# Patient Record
Sex: Female | Born: 1945
Health system: Southern US, Community
[De-identification: ages and names within clinical notes are randomized; demographics above are authoritative.]

## PROBLEM LIST (undated history)

## (undated) DIAGNOSIS — M1990 Unspecified osteoarthritis, unspecified site: Secondary | ICD-10-CM

## (undated) DIAGNOSIS — E785 Hyperlipidemia, unspecified: Secondary | ICD-10-CM

## (undated) DIAGNOSIS — D510 Vitamin B12 deficiency anemia due to intrinsic factor deficiency: Secondary | ICD-10-CM

## (undated) DIAGNOSIS — I10 Essential (primary) hypertension: Secondary | ICD-10-CM

## (undated) DIAGNOSIS — E039 Hypothyroidism, unspecified: Secondary | ICD-10-CM

## (undated) HISTORY — DX: Hypothyroidism, unspecified: E03.9

## (undated) HISTORY — DX: Hyperlipidemia, unspecified: E78.5

## (undated) HISTORY — PX: THYROIDECTOMY: SHX17

## (undated) HISTORY — DX: Unspecified osteoarthritis, unspecified site: M19.90

## (undated) HISTORY — PX: ABDOMINAL HYSTERECTOMY: SHX81

## (undated) HISTORY — DX: Vitamin B12 deficiency anemia due to intrinsic factor deficiency: D51.0

## (undated) HISTORY — DX: Essential (primary) hypertension: I10

---

## 1969-03-06 HISTORY — PX: FRACTURE SURGERY: SHX138

## 1999-02-07 ENCOUNTER — Emergency Department (HOSPITAL_COMMUNITY): Admission: EM | Admit: 1999-02-07 | Discharge: 1999-02-07 | Payer: Self-pay | Admitting: Emergency Medicine

## 1999-02-07 ENCOUNTER — Encounter: Payer: Self-pay | Admitting: Internal Medicine

## 1999-04-22 ENCOUNTER — Other Ambulatory Visit: Admission: RE | Admit: 1999-04-22 | Discharge: 1999-04-22 | Payer: Self-pay | Admitting: Obstetrics and Gynecology

## 1999-07-07 HISTORY — PX: COLONOSCOPY: SHX174

## 1999-08-14 ENCOUNTER — Ambulatory Visit (HOSPITAL_COMMUNITY): Admission: RE | Admit: 1999-08-14 | Discharge: 1999-08-14 | Payer: Self-pay | Admitting: Internal Medicine

## 1999-08-14 ENCOUNTER — Encounter: Payer: Self-pay | Admitting: Internal Medicine

## 1999-08-17 ENCOUNTER — Emergency Department (HOSPITAL_COMMUNITY): Admission: EM | Admit: 1999-08-17 | Discharge: 1999-08-18 | Payer: Self-pay | Admitting: *Deleted

## 1999-12-09 ENCOUNTER — Encounter (INDEPENDENT_AMBULATORY_CARE_PROVIDER_SITE_OTHER): Payer: Self-pay

## 1999-12-09 ENCOUNTER — Other Ambulatory Visit: Admission: RE | Admit: 1999-12-09 | Discharge: 1999-12-09 | Payer: Self-pay | Admitting: Internal Medicine

## 2000-09-08 ENCOUNTER — Other Ambulatory Visit: Admission: RE | Admit: 2000-09-08 | Discharge: 2000-09-08 | Payer: Self-pay | Admitting: Internal Medicine

## 2000-09-14 ENCOUNTER — Other Ambulatory Visit: Admission: RE | Admit: 2000-09-14 | Discharge: 2000-09-14 | Payer: Self-pay | Admitting: Obstetrics & Gynecology

## 2002-09-02 ENCOUNTER — Encounter: Payer: Self-pay | Admitting: Emergency Medicine

## 2002-09-02 ENCOUNTER — Emergency Department (HOSPITAL_COMMUNITY): Admission: EM | Admit: 2002-09-02 | Discharge: 2002-09-02 | Payer: Self-pay | Admitting: Emergency Medicine

## 2004-05-27 ENCOUNTER — Ambulatory Visit: Payer: Self-pay | Admitting: Internal Medicine

## 2004-06-17 ENCOUNTER — Ambulatory Visit: Payer: Self-pay | Admitting: Internal Medicine

## 2004-07-29 ENCOUNTER — Ambulatory Visit: Payer: Self-pay | Admitting: Internal Medicine

## 2004-08-26 ENCOUNTER — Ambulatory Visit: Payer: Self-pay | Admitting: Internal Medicine

## 2004-09-18 ENCOUNTER — Ambulatory Visit: Payer: Self-pay | Admitting: Internal Medicine

## 2004-09-25 ENCOUNTER — Ambulatory Visit: Payer: Self-pay | Admitting: Internal Medicine

## 2004-10-07 ENCOUNTER — Encounter: Admission: RE | Admit: 2004-10-07 | Discharge: 2004-10-07 | Payer: Self-pay | Admitting: Internal Medicine

## 2004-10-29 ENCOUNTER — Ambulatory Visit: Payer: Self-pay | Admitting: Internal Medicine

## 2004-12-02 ENCOUNTER — Ambulatory Visit: Payer: Self-pay | Admitting: Internal Medicine

## 2004-12-12 ENCOUNTER — Other Ambulatory Visit: Admission: RE | Admit: 2004-12-12 | Discharge: 2004-12-12 | Payer: Self-pay | Admitting: Obstetrics and Gynecology

## 2004-12-29 ENCOUNTER — Ambulatory Visit: Payer: Self-pay | Admitting: Internal Medicine

## 2005-01-29 ENCOUNTER — Ambulatory Visit: Payer: Self-pay | Admitting: Internal Medicine

## 2005-03-13 ENCOUNTER — Ambulatory Visit: Payer: Self-pay | Admitting: Internal Medicine

## 2005-03-25 ENCOUNTER — Ambulatory Visit: Payer: Self-pay | Admitting: Internal Medicine

## 2005-04-13 ENCOUNTER — Ambulatory Visit: Payer: Self-pay | Admitting: Internal Medicine

## 2005-05-25 ENCOUNTER — Ambulatory Visit: Payer: Self-pay | Admitting: Internal Medicine

## 2005-07-01 ENCOUNTER — Ambulatory Visit: Payer: Self-pay | Admitting: Internal Medicine

## 2005-08-03 ENCOUNTER — Ambulatory Visit: Payer: Self-pay | Admitting: Family Medicine

## 2005-08-11 ENCOUNTER — Ambulatory Visit: Payer: Self-pay | Admitting: Internal Medicine

## 2005-08-26 ENCOUNTER — Ambulatory Visit: Payer: Self-pay | Admitting: Internal Medicine

## 2005-10-07 ENCOUNTER — Ambulatory Visit: Payer: Self-pay | Admitting: Internal Medicine

## 2005-11-24 ENCOUNTER — Ambulatory Visit: Payer: Self-pay | Admitting: Internal Medicine

## 2005-12-28 ENCOUNTER — Ambulatory Visit: Payer: Self-pay | Admitting: Internal Medicine

## 2006-01-07 ENCOUNTER — Ambulatory Visit: Payer: Self-pay | Admitting: Internal Medicine

## 2006-03-04 ENCOUNTER — Ambulatory Visit: Payer: Self-pay | Admitting: Internal Medicine

## 2006-04-13 ENCOUNTER — Ambulatory Visit: Payer: Self-pay | Admitting: Internal Medicine

## 2006-04-20 ENCOUNTER — Ambulatory Visit: Payer: Self-pay | Admitting: Internal Medicine

## 2006-04-20 LAB — CONVERTED CEMR LAB
ALT: 15 units/L (ref 0–40)
AST: 16 units/L (ref 0–37)
Chol/HDL Ratio, serum: 2.8
Cholesterol: 180 mg/dL (ref 0–200)
HDL: 64.3 mg/dL (ref >39.0)
Hgb A1c MFr Bld: 5.7 % (ref 4.6–6.0)
LDL Cholesterol: 91 mg/dL (ref 0–99)
TSH: 3.99 microintl units/mL (ref 0.35–5.50)
Triglyceride fasting, serum: 122 mg/dL (ref 0–149)
VLDL: 24 mg/dL (ref 0–40)

## 2006-05-12 ENCOUNTER — Ambulatory Visit: Payer: Self-pay | Admitting: Internal Medicine

## 2006-07-09 ENCOUNTER — Ambulatory Visit: Payer: Self-pay | Admitting: Internal Medicine

## 2006-09-02 ENCOUNTER — Ambulatory Visit: Payer: Self-pay | Admitting: Internal Medicine

## 2006-10-12 ENCOUNTER — Ambulatory Visit: Payer: Self-pay | Admitting: Internal Medicine

## 2006-10-20 DIAGNOSIS — I10 Essential (primary) hypertension: Secondary | ICD-10-CM | POA: Insufficient documentation

## 2006-10-20 DIAGNOSIS — E039 Hypothyroidism, unspecified: Secondary | ICD-10-CM | POA: Insufficient documentation

## 2006-12-08 ENCOUNTER — Ambulatory Visit: Payer: Self-pay | Admitting: Internal Medicine

## 2007-01-11 ENCOUNTER — Ambulatory Visit: Payer: Self-pay | Admitting: Internal Medicine

## 2007-03-11 ENCOUNTER — Ambulatory Visit: Payer: Self-pay | Admitting: Internal Medicine

## 2007-03-22 ENCOUNTER — Ambulatory Visit: Payer: Self-pay | Admitting: Family Medicine

## 2007-05-06 ENCOUNTER — Ambulatory Visit: Payer: Self-pay | Admitting: Internal Medicine

## 2007-07-12 ENCOUNTER — Telehealth (INDEPENDENT_AMBULATORY_CARE_PROVIDER_SITE_OTHER): Payer: Self-pay | Admitting: *Deleted

## 2007-07-13 ENCOUNTER — Ambulatory Visit: Payer: Self-pay | Admitting: Internal Medicine

## 2007-09-14 ENCOUNTER — Ambulatory Visit: Payer: Self-pay | Admitting: Internal Medicine

## 2007-09-21 ENCOUNTER — Telehealth (INDEPENDENT_AMBULATORY_CARE_PROVIDER_SITE_OTHER): Payer: Self-pay | Admitting: *Deleted

## 2007-09-21 ENCOUNTER — Emergency Department (HOSPITAL_COMMUNITY): Admission: EM | Admit: 2007-09-21 | Discharge: 2007-09-21 | Payer: Self-pay | Admitting: Emergency Medicine

## 2007-11-17 ENCOUNTER — Ambulatory Visit: Payer: Self-pay | Admitting: Internal Medicine

## 2007-11-17 DIAGNOSIS — D510 Vitamin B12 deficiency anemia due to intrinsic factor deficiency: Secondary | ICD-10-CM | POA: Insufficient documentation

## 2007-11-17 DIAGNOSIS — E8881 Metabolic syndrome: Secondary | ICD-10-CM | POA: Insufficient documentation

## 2008-01-24 ENCOUNTER — Ambulatory Visit: Payer: Self-pay | Admitting: Internal Medicine

## 2008-02-13 ENCOUNTER — Ambulatory Visit: Payer: Self-pay | Admitting: Internal Medicine

## 2008-04-13 ENCOUNTER — Ambulatory Visit: Payer: Self-pay | Admitting: Internal Medicine

## 2008-05-13 ENCOUNTER — Emergency Department (HOSPITAL_COMMUNITY): Admission: EM | Admit: 2008-05-13 | Discharge: 2008-05-14 | Payer: Self-pay | Admitting: Emergency Medicine

## 2008-05-23 ENCOUNTER — Encounter (INDEPENDENT_AMBULATORY_CARE_PROVIDER_SITE_OTHER): Payer: Self-pay | Admitting: *Deleted

## 2008-05-24 ENCOUNTER — Ambulatory Visit: Payer: Self-pay | Admitting: Internal Medicine

## 2008-06-04 ENCOUNTER — Encounter (INDEPENDENT_AMBULATORY_CARE_PROVIDER_SITE_OTHER): Payer: Self-pay | Admitting: *Deleted

## 2008-06-04 LAB — CONVERTED CEMR LAB
Creatinine,U: 114.9 mg/dL
Hgb A1c MFr Bld: 6 % (ref 4.6–6.0)
Microalb Creat Ratio: 5.2 mg/g (ref 0.0–30.0)
Microalb, Ur: 0.6 mg/dL (ref 0.0–1.9)

## 2008-07-03 ENCOUNTER — Ambulatory Visit: Payer: Self-pay | Admitting: Family Medicine

## 2008-09-04 ENCOUNTER — Ambulatory Visit: Payer: Self-pay | Admitting: Internal Medicine

## 2008-09-04 DIAGNOSIS — M5416 Radiculopathy, lumbar region: Secondary | ICD-10-CM | POA: Insufficient documentation

## 2008-11-20 ENCOUNTER — Ambulatory Visit: Payer: Self-pay | Admitting: Internal Medicine

## 2008-11-21 LAB — CONVERTED CEMR LAB: Hgb A1c MFr Bld: 6.2 % (ref 4.6–6.5)

## 2008-11-22 ENCOUNTER — Encounter (INDEPENDENT_AMBULATORY_CARE_PROVIDER_SITE_OTHER): Payer: Self-pay | Admitting: *Deleted

## 2008-12-21 ENCOUNTER — Ambulatory Visit: Payer: Self-pay | Admitting: Internal Medicine

## 2009-05-14 ENCOUNTER — Ambulatory Visit: Payer: Self-pay | Admitting: Internal Medicine

## 2009-05-17 ENCOUNTER — Encounter (INDEPENDENT_AMBULATORY_CARE_PROVIDER_SITE_OTHER): Payer: Self-pay | Admitting: *Deleted

## 2009-05-17 ENCOUNTER — Telehealth (INDEPENDENT_AMBULATORY_CARE_PROVIDER_SITE_OTHER): Payer: Self-pay | Admitting: *Deleted

## 2009-05-17 ENCOUNTER — Ambulatory Visit: Payer: Self-pay | Admitting: Internal Medicine

## 2009-05-17 LAB — CONVERTED CEMR LAB: Vitamin B-12: 166 pg/mL — ABNORMAL LOW (ref 211–911)

## 2009-05-20 ENCOUNTER — Encounter (INDEPENDENT_AMBULATORY_CARE_PROVIDER_SITE_OTHER): Payer: Self-pay | Admitting: *Deleted

## 2009-05-20 LAB — CONVERTED CEMR LAB: Hgb A1c MFr Bld: 6 % (ref 4.6–6.1)

## 2009-08-08 ENCOUNTER — Telehealth (INDEPENDENT_AMBULATORY_CARE_PROVIDER_SITE_OTHER): Payer: Self-pay | Admitting: *Deleted

## 2009-08-09 ENCOUNTER — Ambulatory Visit: Payer: Self-pay | Admitting: Internal Medicine

## 2009-09-04 ENCOUNTER — Ambulatory Visit: Payer: Self-pay | Admitting: Internal Medicine

## 2009-09-04 LAB — CONVERTED CEMR LAB: Rapid Strep: NEGATIVE

## 2009-11-22 ENCOUNTER — Ambulatory Visit: Payer: Self-pay | Admitting: Internal Medicine

## 2009-11-22 DIAGNOSIS — R9431 Abnormal electrocardiogram [ECG] [EKG]: Secondary | ICD-10-CM | POA: Insufficient documentation

## 2009-11-22 DIAGNOSIS — E785 Hyperlipidemia, unspecified: Secondary | ICD-10-CM | POA: Insufficient documentation

## 2009-12-19 ENCOUNTER — Encounter (INDEPENDENT_AMBULATORY_CARE_PROVIDER_SITE_OTHER): Payer: Self-pay | Admitting: *Deleted

## 2010-01-01 ENCOUNTER — Ambulatory Visit: Payer: Self-pay | Admitting: Internal Medicine

## 2010-02-11 ENCOUNTER — Ambulatory Visit: Payer: Self-pay | Admitting: Internal Medicine

## 2010-03-20 ENCOUNTER — Ambulatory Visit: Payer: Self-pay | Admitting: Internal Medicine

## 2010-05-14 ENCOUNTER — Ambulatory Visit: Payer: Self-pay | Admitting: Internal Medicine

## 2010-08-03 LAB — CONVERTED CEMR LAB
ALT: 20 units/L (ref 0–35)
AST: 19 units/L (ref 0–37)
Albumin: 3.7 g/dL (ref 3.5–5.2)
Albumin: 4 g/dL (ref 3.5–5.2)
Alkaline Phosphatase: 101 units/L (ref 39–117)
Alkaline Phosphatase: 73 units/L (ref 39–117)
BUN: 13 mg/dL (ref 6–23)
Basophils Absolute: 0.1 10*3/uL (ref 0.0–0.1)
Basophils Absolute: 0.1 10*3/uL (ref 0.0–0.1)
Basophils Relative: 1 % (ref 0.0–1.0)
Basophils Relative: 1.6 % (ref 0.0–3.0)
Bilirubin, Direct: 0.1 mg/dL (ref 0.0–0.3)
Bilirubin, Direct: 0.1 mg/dL (ref 0.0–0.3)
CO2: 31 meq/L (ref 19–32)
CO2: 31 meq/L (ref 19–32)
Calcium: 8.5 mg/dL (ref 8.4–10.5)
Calcium: 8.9 mg/dL (ref 8.4–10.5)
Chloride: 103 meq/L (ref 96–112)
Chloride: 105 meq/L (ref 96–112)
Cholesterol, target level: 200 mg/dL
Cholesterol: 170 mg/dL (ref 0–200)
Cholesterol: 252 mg/dL — ABNORMAL HIGH (ref 0–200)
Creatinine, Ser: 0.6 mg/dL (ref 0.4–1.2)
Creatinine, Ser: 0.6 mg/dL (ref 0.4–1.2)
Direct LDL: 186 mg/dL
Eosinophils Absolute: 0.2 10*3/uL (ref 0.0–0.7)
Eosinophils Absolute: 0.2 10*3/uL (ref 0.0–0.7)
Eosinophils Relative: 3.5 % (ref 0.0–5.0)
GFR calc Af Amer: 130 mL/min
GFR calc non Af Amer: 108 mL/min
Glucose, Bld: 87 mg/dL (ref 70–99)
Glucose, Bld: 97 mg/dL (ref 70–99)
HCT: 39.1 % (ref 36.0–46.0)
HDL goal, serum: 50 mg/dL
HDL: 58.5 mg/dL (ref >39.0)
Hemoglobin: 13.3 g/dL (ref 12.0–15.0)
Hgb A1c MFr Bld: 6 % (ref 4.6–6.5)
Hgb A1c MFr Bld: 6.1 % — ABNORMAL HIGH (ref 4.6–6.0)
LDL Cholesterol: 101 mg/dL — ABNORMAL HIGH (ref 0–99)
LDL Goal: 100 mg/dL
Lymphocytes Relative: 44.8 % (ref 12.0–46.0)
Lymphocytes Relative: 48.3 % — ABNORMAL HIGH (ref 12.0–46.0)
MCHC: 33.5 g/dL (ref 30.0–36.0)
MCHC: 34 g/dL (ref 30.0–36.0)
MCV: 96.5 fL (ref 78.0–100.0)
MCV: 99.1 fL (ref 78.0–100.0)
Monocytes Absolute: 0.4 10*3/uL (ref 0.1–1.0)
Monocytes Absolute: 0.4 10*3/uL (ref 0.1–1.0)
Monocytes Relative: 6.9 % (ref 3.0–12.0)
Neutro Abs: 2.2 10*3/uL (ref 1.4–7.7)
Neutro Abs: 2.7 10*3/uL (ref 1.4–7.7)
Neutrophils Relative %: 40.3 % — ABNORMAL LOW (ref 43.0–77.0)
Neutrophils Relative %: 43.9 % (ref 43.0–77.0)
Platelets: 328 10*3/uL (ref 150–400)
Potassium: 3.8 meq/L (ref 3.5–5.1)
RBC: 4.05 M/uL (ref 3.87–5.11)
RDW: 12.9 % (ref 11.5–14.6)
RDW: 14.4 % (ref 11.5–14.6)
Sodium: 141 meq/L (ref 135–145)
TSH: 3.81 microintl units/mL (ref 0.35–5.50)
Total Bilirubin: 0.6 mg/dL (ref 0.3–1.2)
Total CHOL/HDL Ratio: 2.9
Total Protein: 7.1 g/dL (ref 6.0–8.3)
Total Protein: 7.2 g/dL (ref 6.0–8.3)
Triglycerides: 53 mg/dL (ref 0–149)
Triglycerides: 69 mg/dL (ref 0.0–149.0)
VLDL: 11 mg/dL (ref 0–40)
Vitamin B-12: 191 pg/mL — ABNORMAL LOW (ref 211–911)
Vitamin B-12: 215 pg/mL (ref 211–911)
WBC: 5.4 10*3/uL (ref 4.5–10.5)

## 2010-08-07 NOTE — Assessment & Plan Note (Signed)
Summary: HEAD CONGESTION,WANTS B12 SHOT/RH......   Vital Signs:  Patient profile:   65 year old female Weight:      224.6 pounds Temp:     98.0 degrees F oral Pulse rate:   84 / minute Resp:     17 per minute BP sitting:   138 / 80  (left arm) Cuff size:   large  Vitals Entered By: Shonna Chock CMA (March 20, 2010 12:25 PM) CC: Head congestion , URI symptoms   CC:  Head congestion  and URI symptoms.  History of Present Illness: URI Symptoms      This is a 65 year old woman who presents with URI symptoms; onset 09/13 as head congestion & sore throat.  The patient reports nasal congestion, sore throat, and productive cough, but denies purulent nasal discharge and earache.  Associated symptoms include dyspnea and wheezing.  The patient denies fever.  The patient denies headache.  The patient denies the following risk factors for Strep sinusitis: unilateral facial pain, tooth pain, and tender adenopathy.  Rx: Mucinex  Current Medications (verified): 1)  Quinapril Hcl 40 Mg  Tabs (Quinapril Hcl) .... Take 1 1/2 By Mouth Once Daily 2)  Levoxyl 75 Mcg  Tabs (Levothyroxine Sodium) .... Take One Tablet Daily Except 1&1/2 T & Th 3)  Potassium Chloride Cr 8 Meq Tbcr (Potassium Chloride) .Marland Kitchen.. 1 By Mouth Once Daily 4)  Vitamin B-12 Cr 1000 Mcg  Tbcr (Cyanocobalamin) .Marland Kitchen.. 1cc Im Injection Monthly 5)  [employer]ons Buffered Aspirin 325 Mg  Tabs (Aspirin Buffered) .Marland Kitchen.. 1 By Mouth Qd 6)  Hydrochlorothiazide 25 Mg  Tabs (Hydrochlorothiazide) .... 1/2 Tab Qd 7)  Lipitor 20 Mg  Tabs (Atorvastatin Calcium) .Marland Kitchen.. 1 Once Daily 8)  Norvasc 5 Mg  Tabs (Amlodipine Besylate) .Marland Kitchen.. 1 Qd  Allergies (verified): No Known Drug Allergies  Physical Exam  General:  well-nourished,in no acute distress; alert,appropriate and cooperative throughout examination Ears:  External ear exam shows no significant lesions or deformities.  Otoscopic examination reveals clear canals, tympanic membranes are intact bilaterally  without bulging, retraction, inflammation or discharge. Hearing is grossly normal bilaterally. Nose:  External nasal examination shows no deformity or inflammation. Nasal mucosa are pink and moist without lesions or exudates. Mouth:  Oral mucosa and oropharynx without lesions or exudates.  Teeth in good repair. Mild pharyngeal erythema.   Lungs:  Normal respiratory effort, chest expands symmetrically. Lungs are clear to auscultation, no crackles or wheezes. Heart:  Normal rate and regular rhythm. S1 and S2 normal without gallop, murmur, click, rub .S4 Cervical Nodes:  No lymphadenopathy noted Axillary Nodes:  No palpable lymphadenopathy   Impression & Recommendations:  Problem # 1:  PHARYNGITIS-ACUTE (ICD-462)  Her updated medication list for this problem includes:    [employer]ons Buffered Aspirin 325 Mg Tabs (Aspirin buffered) .Marland Kitchen... 1 by mouth qd    Azithromycin 250 Mg Tabs (Azithromycin) .Marland Kitchen... As per pack  Problem # 2:  BRONCHITIS-ACUTE (ICD-466.0)  Her updated medication list for this problem includes:    Azithromycin 250 Mg Tabs (Azithromycin) .Marland Kitchen... As per pack    Benzonatate 200 Mg Caps (Benzonatate) .Marland Kitchen... 1 eveey 6 hrs as needed cough  Complete Medication List: 1)  Quinapril Hcl 40 Mg Tabs (Quinapril hcl) .... Take 1 1/2 by mouth once daily 2)  Levoxyl 75 Mcg Tabs (Levothyroxine sodium) .... Take one tablet daily except 1&1/2 t & th 3)  Potassium Chloride Cr 8 Meq Tbcr (Potassium chloride) .Marland Kitchen.. 1 by mouth once daily 4)  Vitamin  B-12 Cr 1000 Mcg Tbcr (Cyanocobalamin) .Marland Kitchen.. 1cc im injection monthly 5)  [employer] Buffered Aspirin 325 Mg Tabs (Aspirin buffered) .Marland Kitchen.. 1 by mouth qd 6)  Hydrochlorothiazide 25 Mg Tabs (Hydrochlorothiazide) .... 1/2 tab qd 7)  Lipitor 20 Mg Tabs (Atorvastatin calcium) .Marland Kitchen.. 1 once daily 8)  Norvasc 5 Mg Tabs (Amlodipine besylate) .Marland Kitchen.. 1 qd 9)  Azithromycin 250 Mg Tabs (Azithromycin) .... As per pack 10)  Benzonatate 200 Mg Caps (Benzonatate) .Marland Kitchen.. 1 eveey 6  hrs as needed cough  Other Orders: Vit B12 1000 mcg (J3420) Admin of Therapeutic Inj  intramuscular or subcutaneous (16109)  Patient Instructions: 1)  Neti pot once daily as needed for head congestion.Plain Mucinex if secretions are thick.Drink as much NON dairy  fluid as you can tolerate for the next few days. 2)  Recommended remaining out of work for 09/15 & 09/16. Prescriptions: BENZONATATE 200 MG CAPS (BENZONATATE) 1 eveey 6 hrs as needed cough  #15 x 0   Entered and Authorized by:   Marga Melnick MD   Signed by:   Marga Melnick MD on 03/20/2010   Method used:   Faxed to ...       [facility]  [address] [phone]* (retail)       [address]       [place], [place]  [address]       Ph: [phone]       Fax: [phone]   RxID:   [id number] AZITHROMYCIN 250 MG TABS (AZITHROMYCIN) as per pack  #1 x 0   Entered and Authorized by:   Marga Melnick MD   Signed by:   Marga Melnick MD on 03/20/2010   Method used:   Faxed to ...       [facility]  [address] [phone]* (retail)       [address]       [place], [place]  [address]       Ph: [phone]       Fax: [phone]   RxID:   [id number]    Medication Administration  Injection # 1:    Medication: Vit B12 1000 mcg    Diagnosis: ANEMIA, VITAMIN B12 DEFICIENCY (ICD-281.1)    Route: IM    Site: R deltoid    Exp Date: 09/04/2010    Lot #: [id number]    Mfr: American Regent    Patient tolerated injection without complications    Given by: Almeta Monas CMA Duncan Dull) (March 20, 2010 12:59 PM)  Orders Added: 1)  Est. Patient Level III [93235] 2)  Vit B12 1000 mcg [J3420] 3)  Admin of Therapeutic Inj  intramuscular or subcutaneous [57322]

## 2010-08-07 NOTE — Letter (Signed)
Summary: Colonoscopy Letter  [facility]  [address] [place], [place] [address]   Phone: [phone]  Fax: [phone]      December 19, 2009 MRN: [id number]   SARAFINA PUTHOFF [address] [place], [place]  [address]   Dear Ms. Llewellyn,   According to your medical record, it is time for you to schedule a Colonoscopy. The American Cancer Society recommends this procedure as a method to detect early colon cancer. Patients with a family history of colon cancer, or a personal history of colon polyps or inflammatory bowel disease are at increased risk.  This letter has been generated based on the recommendations made at the time of your procedure. If you feel that in your particular situation this may no longer apply, please contact our office.  Please call our office at [phone] to schedule this appointment or to update your records at your earliest convenience.  Thank you for cooperating with [place] to provide you with the very best care possible.   Sincerely,  Alita Chyle, M.D.  [facility] [phone]

## 2010-08-07 NOTE — Assessment & Plan Note (Signed)
Summary: congestion,sore throat//lch//b12 inj//lch   Vital Signs:  Patient profile:   65 year old female Weight:      225 pounds O2 Sat:      98 % on Room air Temp:     98.2 degrees F oral Pulse rate:   80 / minute Resp:     17 per minute BP sitting:   120 / 80  (left arm)  Vitals Entered By: Jeremy Johann CMA (September 04, 2009 12:50 PM)  O2 Flow:  Room air CC: COUGH, BURNING IN THROAT X1DAY Comments REVIEWED MED LIST, PATIENT AGREED DOSE AND INSTRUCTION CORRECT    CC:  COUGH and BURNING IN THROAT X1DAY.  History of Present Illness: Onset as ST & cough with gray sputum as of 09/03/2009.Rx: left over cough syrup. Flu shot taken in 05/2009.  Allergies (verified): No Known Drug Allergies  Review of Systems General:  Complains of chills; denies fever and sweats. ENT:  Denies nasal congestion, postnasal drainage, and sinus pressure; No fronyal headache, facial pain or purulence. Resp:  Complains of shortness of breath and wheezing; denies chest pain with inspiration and pleuritic. Allergy:  Denies itching eyes and sneezing.  Physical Exam  General:  in no acute distress; alert,appropriate and cooperative throughout examination Ears:  External ear exam shows no significant lesions or deformities.  Otoscopic examination reveals clear canals, tympanic membranes are intact bilaterally without bulging, retraction, inflammation or discharge. Hearing is grossly normal bilaterally. Nose:  External nasal examination shows no deformity or inflammation. Nasal mucosa are pink and moist without lesions or exudates. Mouth:  Oral mucosa and oropharynx without lesions or exudates.  Teeth in good repair. Mild pharyngeal erythema.   Lungs:  Normal respiratory effort, chest expands symmetrically. Lungs are clear to auscultation, no crackles or wheezes. barking cough Cervical Nodes:  No lymphadenopathy noted Axillary Nodes:  No palpable lymphadenopathy   Impression & Recommendations:  Problem #  1:  BRONCHITIS-ACUTE (ICD-466.0)  The following medications were removed from the medication list:    Promethazine Vc/codeine 6.25-5-10 Mg/66ml Syrp (Phenyleph-promethazine-cod) .Marland Kitchen... 1 tsp q 6 hrs as needed    Azithromycin 250 Mg Tabs (Azithromycin) .Marland Kitchen... As per pack Her updated medication list for this problem includes:    Doxycycline Hyclate 100 Mg Caps (Doxycycline hyclate) .Marland Kitchen... 1 two times a day x 2 days then 1 qd    Promethazine Vc/codeine 6.25-5-10 Mg/30ml Syrp (Phenyleph-promethazine-cod) .Marland Kitchen... 1 tsp q 6 hrs as needed cough  Problem # 2:  PHARYNGITIS-ACUTE (ICD-462)  The following medications were removed from the medication list:    Azithromycin 250 Mg Tabs (Azithromycin) .Marland Kitchen... As per pack Her updated medication list for this problem includes:    [employer] Buffered Aspirin 325 Mg Tabs (Aspirin buffered) .Marland Kitchen... 1 by mouth qd    Doxycycline Hyclate 100 Mg Caps (Doxycycline hyclate) .Marland Kitchen... 1 two times a day x 2 days then 1 qd  Orders: Rapid Strep (16109)  Complete Medication List: 1)  Quinapril Hcl 40 Mg Tabs (Quinapril hcl) .... Take 1 1/2 by mouth once daily 2)  Levoxyl 75 Mcg Tabs (Levothyroxine sodium) .... Take one tablet daily. 3)  Potassium Chloride Cr 8 Meq Tbcr (Potassium chloride) .Marland Kitchen.. 1 by mouth once daily 4)  Vitamin B-12 Cr 1000 Mcg Tbcr (Cyanocobalamin) .Marland Kitchen.. 1cc im injection monthly 5)  [employer]ons Buffered Aspirin 325 Mg Tabs (Aspirin buffered) .Marland Kitchen.. 1 by mouth qd 6)  Hydrochlorothiazide 25 Mg Tabs (Hydrochlorothiazide) .... 1/2 tab qd 7)  Lipitor 20 Mg Tabs (Atorvastatin calcium) .Marland KitchenMarland KitchenMarland Kitchen  1 by mouth mon,wed,fri,sunday. need ov 8)  Norvasc 5 Mg Tabs (Amlodipine besylate) .... 1 qd 9)  Doxycycline Hyclate 100 Mg Caps (Doxycycline hyclate) .... 1 two times a day x 2 days then 1 qd 10)  Promethazine Vc/codeine 6.25-5-10 Mg/5ml Syrp (Phenyleph-promethazine-cod) .... 1 tsp q 6 hrs as needed cough  Other Orders: Admin of Therapeutic Inj  intramuscular or subcutaneous  (96372) Vit B12 1000 mcg (J3420)  Patient Instructions: 1)  Drink as much fluid as you can tolerate for the next few days. Prescriptions: PROMETHAZINE VC/CODEINE 6.25-5-10 MG/5ML SYRP (PHENYLEPH-PROMETHAZINE-COD) 1 tsp q 6 hrs as needed cough  #120cc x 0   Entered and Authorized by:   William Hopper MD   Signed by:   William Hopper MD on 09/04/2009   Method used:   Printed then faxed to ...       [facility]  [address] #[id number]* (retail)       [address]       [place], [place]  [address]       Ph: [phone]       Fax: [phone]   RxID:   [id number] DOXYCYCLINE HYCLATE 100 MG CAPS (DOXYCYCLINE HYCLATE) 1 two times a day X 2 days then 1 qd  #12 x 0   Entered and Authorized by:   William Hopper MD   Signed by:   William Hopper MD on 09/04/2009   Method used:   Faxed to ...       [facility]  [address] #[id number]* (retail)       [address]       [place], [place]  [address]       Ph: [phone]       Fax: [phone]   RxID:   [id number]   Laboratory Results   Date/Time Reported: September 04, 2009 12:56 PM  Other Tests  Rapid Strep: negative     Medication Administration  Injection # 1:    Medication: Vit B12 1000 mcg    Diagnosis: ANEMIA, VITAMIN B12 DEFICIENCY (ICD-281.1)    Route: IM    Site: L deltoid    Exp Date: 11/04/2010    Lot #: [id number]    Mfr: American Regent    Patient tolerated injection without complications    Given by: Jeremy Johann CMA (September 04, 2009 12:56 PM)  Orders Added: 1)  Admin of Therapeutic Inj  intramuscular or subcutaneous [96372] 2)  Vit B12 1000 mcg [J3420] 3)  Est. Patient Level III [63875] 4)  Rapid Strep [64332]

## 2010-08-07 NOTE — Assessment & Plan Note (Signed)
Summary: cpx//pt will be fasting//lh   Vital Signs:  Patient profile:   65 year old female Height:      65.25 inches Weight:      222.8 pounds BMI:     36.93 Temp:     97.7 degrees F oral Pulse rate:   72 / minute Resp:     14 per minute BP sitting:   132 / 88  (left arm) Cuff size:   large  Vitals Entered By: Shonna Chock (Nov 22, 2009 9:46 AM)  Comments REVIEWED MED LIST, PATIENT AGREED DOSE AND INSTRUCTION CORRECT    History of Present Illness: Dorothy Ritter is here for a physical; she is asymptomatic.  Hypertension History:      She denies headache, chest pain, palpitations, dyspnea with exertion, orthopnea, PND, peripheral edema, visual symptoms, neurologic problems, syncope, and side effects from treatment.  BP @ home ?Marland Kitchen        Positive major cardiovascular risk factors include female age 66 years old or older, hyperlipidemia, and hypertension.  Negative major cardiovascular risk factors include no history of diabetes, negative family history for ischemic heart disease, and non-tobacco-user status.        Further assessment for target organ damage reveals no history of ASHD, stroke/TIA, or peripheral vascular disease.    Lipid Management History:      Positive NCEP/ATP III risk factors include female age 66 years old or older and hypertension.  Negative NCEP/ATP III risk factors include no history of early menopause without estrogen hormone replacement, non-diabetic, no family history for ischemic heart disease, non-tobacco-user status, no ASHD (atherosclerotic heart disease), no prior stroke/TIA, no peripheral vascular disease, and no history of aortic aneurysm.      Preventive Screening-Counseling & Management  Caffeine-Diet-Exercise     Does Patient Exercise: yes  Allergies (verified): No Known Drug Allergies  Past History:  Past Medical History: ANEMIA, PERNICIOUS (ICD-281.0) HYPOTHYROIDISM (ICD-244.9) HYPERTENSION (ICD-401.9) Hyperlipidemia: [place] LDL  goal = < 130. NMR Lipoprofile  2007: LDL 130(1657/1258),TG 60, HDL 95. Ideal LDL < 110  Past Surgical History: Hysterectomy for dysfunctional menses, Dr Rana Snare Thyroidectomy for goiter(no malignancy) Chest pain 2002 Colonoscopy negative  2003  Family History: mother: pacer; father: prostate cancer; sister: oral acncer  Social History: Never Smoked No diet Occupation: Best boy Married Alcohol use-no Regular exercise-yes: gym  Review of Systems General:  Denies fatigue and sleep disorder. Eyes:  Denies blurring, double vision, and vision loss-both eyes. ENT:  Denies difficulty swallowing and hoarseness. CV:  Denies leg cramps with exertion, lightheadness, and near fainting. Resp:  Denies cough, excessive snoring, hypersomnolence, morning headaches, and sputum productive. GI:  Denies abdominal pain, bloody stools, dark tarry stools, and indigestion. GU:  Denies discharge, dysuria, and hematuria. MS:  Denies joint pain, joint redness, joint swelling, low back pain, mid back pain, and thoracic pain. Derm:  Denies changes in nail beds, excessive perspiration, hair loss, and lesion(s). Neuro:  Denies disturbances in coordination, numbness, poor balance, tingling, and weakness. Psych:  Denies anxiety and depression. Endo:  Denies cold intolerance, excessive hunger, excessive thirst, excessive urination, and heat intolerance. Heme:  Denies abnormal bruising and bleeding. Allergy:  Denies itching eyes and sneezing.  Physical Exam  General:  well-nourished; alert,appropriate and cooperative throughout examination Head:  Normocephalic and atraumatic without obvious abnormalities. No apparent alopecia or balding. Eyes:  No corneal or conjunctival inflammation noted. Slight ptosis OS. Perrla. Funduscopic exam benign, without hemorrhages, exudates or papilledema.  Ears:  External ear exam shows no  significant lesions or deformities.  Otoscopic examination reveals clear canals, tympanic  membranes are intact bilaterally without bulging, retraction, inflammation or discharge. Hearing is grossly normal bilaterally. Nose:  External nasal examination shows no deformity or inflammation. Nasal mucosa are pink and moist without lesions or exudates. Mouth:  Oral mucosa and oropharynx without lesions or exudates.  Teeth in good repair. Neck:  No deformities, masses, or tenderness noted. Thyroid absent  Lungs:  Normal respiratory effort, chest expands symmetrically. Lungs are clear to auscultation, no crackles or wheezes. Heart:  Normal rate and regular rhythm. S1 and S2 normal without gallop, murmur, click, rub or other extra sounds. Abdomen:  Bowel sounds positive,abdomen soft and non-tender without masses, organomegaly or hernias noted. Genitalia:  Dr Rana Snare Msk:  No deformity or scoliosis noted of thoracic or lumbar spine.   Pulses:  R and L carotid,radial,dorsalis pedis and posterior tibial pulses are full and equal bilaterally Extremities:  No clubbing, cyanosis, edema, or deformity noted with normal full range of motion of all joints.   Neurologic:  alert & oriented X3 and DTRs symmetrical and normal.   Skin:  Intact without suspicious lesions or rashes Cervical Nodes:  No lymphadenopathy noted Axillary Nodes:  No palpable lymphadenopathy Psych:  memory intact for recent and remote, normally interactive, and good eye contact.     Impression & Recommendations:  Problem # 1:  ROUTINE GENERAL MEDICAL EXAM@HEALTH  CARE FACL (ICD-V70.0)  Orders: EKG w/ Interpretation (93000) Venipuncture (04540) TLB-Lipid Panel (80061-LIPID) TLB-BMP (Basic Metabolic Panel-BMET) (80048-METABOL) TLB-CBC Platelet - w/Differential (85025-CBCD) TLB-Hepatic/Liver Function Pnl (80076-HEPATIC) TLB-TSH (Thyroid Stimulating Hormone) (84443-TSH) TLB-A1C / Hgb A1C (Glycohemoglobin) (83036-A1C) TLB-B12, Serum-Total ONLY (98119-J47)  Problem # 2:  HYPERLIPIDEMIA (ICD-272.4)  Her updated medication list for  this problem includes:    Lipitor 20 Mg Tabs (Atorvastatin calcium) .Marland Kitchen... 1 by mouth mon,wed,fri,sunday.  Orders: Venipuncture (82956) TLB-Lipid Panel (80061-LIPID)  Problem # 3:  ANEMIA, VITAMIN B12 DEFICIENCY (ICD-281.1)  Her updated medication list for this problem includes:    Vitamin B-12 Cr 1000 Mcg Tbcr (Cyanocobalamin) .Marland Kitchen... 1cc im injection monthly  Orders: Venipuncture (21308) TLB-B12, Serum-Total ONLY (65784-O96)  Problem # 4:  DYSMETABOLIC SYNDROME X (ICD-277.7)  Orders: Venipuncture (29528) TLB-A1C / Hgb A1C (Glycohemoglobin) (83036-A1C)  Problem # 5:  ELECTROCARDIOGRAM, ABNORMAL (ICD-794.31)  Bifasicular block, asymptomatic. Her mothe has pacer. High risk symptoms which would necessitate further evaluation were  discussed ;none present  Orders: EKG w/ Interpretation (93000)  Problem # 6:  HYPOTHYROIDISM (ICD-244.9)  Her updated medication list for this problem includes:    Levoxyl 75 Mcg Tabs (Levothyroxine sodium) .Marland Kitchen... Take one tablet daily.  Orders: Venipuncture (41324) TLB-TSH (Thyroid Stimulating Hormone) (84443-TSH)  Problem # 7:  HYPERTENSION (ICD-401.9)  Her updated medication list for this problem includes:    Quinapril Hcl 40 Mg Tabs (Quinapril hcl) .Marland Kitchen... Take 1 1/2 by mouth once daily    Hydrochlorothiazide 25 Mg Tabs (Hydrochlorothiazide) .Marland Kitchen... 1/2 tab qd    Norvasc 5 Mg Tabs (Amlodipine besylate) .Marland Kitchen... 1 qd  Orders: EKG w/ Interpretation (93000) Venipuncture (40102)  Complete Medication List: 1)  Quinapril Hcl 40 Mg Tabs (Quinapril hcl) .... Take 1 1/2 by mouth once daily 2)  Levoxyl 75 Mcg Tabs (Levothyroxine sodium) .... Take one tablet daily. 3)  Potassium Chloride Cr 8 Meq Tbcr (Potassium chloride) .Marland Kitchen.. 1 by mouth once daily 4)  Vitamin B-12 Cr 1000 Mcg Tbcr (Cyanocobalamin) .Marland Kitchen.. 1cc im injection monthly 5)  [employer] Buffered Aspirin 325 Mg Tabs (Aspirin buffered) .Marland Kitchen.. 1 by  mouth qd 6)  Hydrochlorothiazide 25 Mg Tabs  (Hydrochlorothiazide) .... 1/2 tab qd 7)  Lipitor 20 Mg Tabs (Atorvastatin calcium) .Marland Kitchen.. 1 by mouth mon,wed,fri,sunday. 8)  Norvasc 5 Mg Tabs (Amlodipine besylate) .Marland Kitchen.. 1 qd  Hypertension Assessment/Plan:      The patient's hypertensive risk group is category B: At least one risk factor (excluding diabetes) with no target organ damage.  Her calculated 10 year risk of coronary heart disease is 9 %.  Today's blood pressure is 132/88.    Lipid Assessment/Plan:      Based on NCEP/ATP III, the patient's risk factor category is "0-1 risk factors".  The patient's lipid goals are as follows: Total cholesterol goal is 200; LDL cholesterol goal is 100; HDL cholesterol goal is 50; Triglyceride goal is 150.    Patient Instructions: 1)  Check your Blood Pressure regularly. If it is above: 140/90 ON AVERAGE you should make an appointment.BP goal = < 135/85 preferably Prescriptions: NORVASC 5 MG  TABS (AMLODIPINE BESYLATE) 1 qd  #90 x 3   Entered and Authorized by:   Marga Melnick MD   Signed by:   Marga Melnick MD on 11/22/2009   Method used:   Print then Give to Patient   RxID:   [id number] LIPITOR 20 MG  TABS (ATORVASTATIN CALCIUM) 1 by mouth mon,wed,fri,sunday.  #90 x 1   Entered and Authorized by:   Marga Melnick MD   Signed by:   Marga Melnick MD on 11/22/2009   Method used:   Print then Give to Patient   RxID:   [id number] HYDROCHLOROTHIAZIDE 25 MG  TABS (HYDROCHLOROTHIAZIDE) 1/2 tab qd  #90 x 1   Entered and Authorized by:   Marga Melnick MD   Signed by:   Marga Melnick MD on 11/22/2009   Method used:   Print then Give to Patient   RxID:   [id number] POTASSIUM CHLORIDE CR 8 MEQ TBCR (POTASSIUM CHLORIDE) 1 by mouth once daily  #90 x 1   Entered and Authorized by:   Marga Melnick MD   Signed by:   Marga Melnick MD on 11/22/2009   Method used:   Print then Give to Patient   RxID:   [id number] LEVOXYL 75 MCG  TABS (LEVOTHYROXINE SODIUM) Take one tablet daily.  Brand medically necessary #90 x 3   Entered and Authorized by:   Marga Melnick MD   Signed by:   Marga Melnick MD on 11/22/2009   Method used:   Print then Give to Patient   RxID:   [id number] QUINAPRIL HCL 40 MG  TABS (QUINAPRIL HCL) take 1 1/2 by mouth once daily  #120 x 3   Entered and Authorized by:   Marga Melnick MD   Signed by:   Marga Melnick MD on 11/22/2009   Method used:   Print then Give to Patient   RxID:   [id number]

## 2010-08-07 NOTE — Assessment & Plan Note (Signed)
Summary: B12 INJ, AND FLU SHOT/RH.........  Nurse Visit  CC: B-12 inj and flu shot./kb   Allergies: No Known Drug Allergies  Medication Administration  Injection # 1:    Medication: Vit B12 1000 mcg    Diagnosis: ANEMIA, VITAMIN B12 DEFICIENCY (ICD-281.1)    Route: IM    Site: L deltoid    Exp Date: 09/04/2011    Lot #: [id number]    Mfr: American Regent    Patient tolerated injection without complications    Given by: Lucious Groves CMA (May 14, 2010 9:52 AM)  Orders Added: 1)  Admin 1st Vaccine [90471] 2)  Flu Vaccine 24yrs + [21308] 3)  Vit B12 1000 mcg [J3420] 4)  Admin of Therapeutic Inj  intramuscular or subcutaneous [96372]          Flu Vaccine Consent Questions     Do you have a history of severe allergic reactions to this vaccine? no    Any prior history of allergic reactions to egg and/or gelatin? no    Do you have a sensitivity to the preservative Thimersol? no    Do you have a past history of Guillan-Barre Syndrome? no    Do you currently have an acute febrile illness? no    Have you ever had a severe reaction to latex? no    Vaccine information given and explained to patient? yes    Are you currently pregnant? no    Lot Number:[id number]   Exp Date:01/03/2011   Site Given  Left Deltoid IMu

## 2010-08-07 NOTE — Assessment & Plan Note (Signed)
Summary: B12/KN  Nurse Visit   Complete Medication List: 1)  Quinapril Hcl 40 Mg Tabs (Quinapril hcl) .... Take 1 1/2 by mouth once daily 2)  Levoxyl 75 Mcg Tabs (Levothyroxine sodium) .... Take one tablet daily except 1&1/2 t & th 3)  Potassium Chloride Cr 8 Meq Tbcr (Potassium chloride) .Marland Kitchen.. 1 by mouth once daily 4)  Vitamin B-12 Cr 1000 Mcg Tbcr (Cyanocobalamin) .Marland Kitchen.. 1cc im injection monthly 5)  [employer] Buffered Aspirin 325 Mg Tabs (Aspirin buffered) .Marland Kitchen.. 1 by mouth qd 6)  Hydrochlorothiazide 25 Mg Tabs (Hydrochlorothiazide) .... 1/2 tab qd 7)  Lipitor 20 Mg Tabs (Atorvastatin calcium) .Marland Kitchen.. 1 once daily 8)  Norvasc 5 Mg Tabs (Amlodipine besylate) .Marland Kitchen.. 1 qd  Other Orders: Vit B12 1000 mcg (J3420) Admin of Therapeutic Inj  intramuscular or subcutaneous (16109)   Allergies: No Known Drug Allergies  Medication Administration  Injection # 1:    Medication: Vit B12 1000 mcg    Diagnosis: ANEMIA, VITAMIN B12 DEFICIENCY (ICD-281.1)    Route: IM    Site: L deltoid    Exp Date: 09/2011    Lot #: [id number]    Mfr: American Regent    Patient tolerated injection without complications    Given by: Chrae Malloy/CMA  Orders Added: 1)  Vit B12 1000 mcg [J3420] 2)  Admin of Therapeutic Inj  intramuscular or subcutaneous [60454]

## 2010-08-07 NOTE — Progress Notes (Signed)
Summary: Appointment Due  Phone Note Outgoing Call Call back at [facility] Phone [phone]   Call placed by: Shonna Chock,  August 08, 2009 1:11 PM Call placed to: Patient Summary of Call: Please contact patient to schedule a CPX with fasting labs. This is necessary to continue refilling meds. As long as patient has a pending appointment we will fill until appointment.  Marland KitchenShonna Chock  August 08, 2009 1:12 PM   Follow-up for Phone Call        pt made an appt for 11/22/09.  Follow-up by: Harold Barban,  August 08, 2009 1:18 PM

## 2010-08-07 NOTE — Assessment & Plan Note (Signed)
Summary: B12/KN  Nurse Visit   Allergies: No Known Drug Allergies  Medication Administration  Injection # 1:    Medication: Vit B12 1000 mcg    Diagnosis: ANEMIA, VITAMIN B12 DEFICIENCY (ICD-281.1)    Route: IM    Site: R deltoid    Exp Date: 09/2011    Lot #: [id number]    Mfr: American Regent    Patient tolerated injection without complications    Given by: Army Fossa CMA (January 01, 2010 2:05 PM)  Orders Added: 1)  Admin of Therapeutic Inj  intramuscular or subcutaneous [96372] 2)  Vit B12 1000 mcg [J3420]

## 2010-09-29 ENCOUNTER — Other Ambulatory Visit: Payer: Self-pay | Admitting: Internal Medicine

## 2010-10-08 ENCOUNTER — Ambulatory Visit (INDEPENDENT_AMBULATORY_CARE_PROVIDER_SITE_OTHER): Payer: 59 | Admitting: *Deleted

## 2010-10-08 DIAGNOSIS — E538 Deficiency of other specified B group vitamins: Secondary | ICD-10-CM

## 2010-10-08 MED ORDER — CYANOCOBALAMIN 1000 MCG/ML IJ SOLN
1000.0000 ug | Freq: Once | INTRAMUSCULAR | Status: AC
  Administered 2010-10-08: 1000 ug via INTRAMUSCULAR

## 2010-11-17 ENCOUNTER — Ambulatory Visit (INDEPENDENT_AMBULATORY_CARE_PROVIDER_SITE_OTHER): Payer: 59 | Admitting: Family Medicine

## 2010-11-17 ENCOUNTER — Encounter: Payer: Self-pay | Admitting: Family Medicine

## 2010-11-17 VITALS — BP 120/80 | Temp 99.0°F | Wt 223.4 lb

## 2010-11-17 DIAGNOSIS — E538 Deficiency of other specified B group vitamins: Secondary | ICD-10-CM

## 2010-11-17 DIAGNOSIS — R0982 Postnasal drip: Secondary | ICD-10-CM

## 2010-11-17 DIAGNOSIS — D518 Other vitamin B12 deficiency anemias: Secondary | ICD-10-CM

## 2010-11-17 MED ORDER — FLUTICASONE FUROATE 27.5 MCG/SPRAY NA SUSP: 2.0000 | Freq: Every day | NASAL | Status: DC

## 2010-11-17 MED ORDER — BENZONATATE 200 MG PO CAPS: 200.0000 mg | ORAL_CAPSULE | Freq: Three times a day (TID) | ORAL | Status: AC | PRN

## 2010-11-17 MED ORDER — CYANOCOBALAMIN 1000 MCG/ML IJ SOLN
1000.0000 ug | Freq: Once | INTRAMUSCULAR | Status: AC
  Administered 2010-11-17: 1000 ug via INTRAMUSCULAR

## 2010-11-17 NOTE — Assessment & Plan Note (Signed)
Pt's sxs are most likely due to PND.  Start nasal spray (sample given), use Coricidin HBP, increase fluid intake.  Cough meds prn.  Reviewed supportive care and red flags that should prompt return.  Pt expressed understanding and is in agreement w/ plan.

## 2010-11-17 NOTE — Patient Instructions (Signed)
Schedule your physical w/ Dr Alwyn Ren This appears to be related to your nasal congestion and post nasal drip Start the Veramyst- 2 sprays each nostril daily- to decrease the congestion and nasal drainage Add Coricidin HBP for congestion Drink plenty of fluids Use the cough pills as needed Call with any questions or concerns Hang in there!

## 2010-11-17 NOTE — Assessment & Plan Note (Signed)
injxn given. 

## 2010-11-17 NOTE — Progress Notes (Signed)
  Subjective:    Patient ID: Dorothy Ritter, female    DOB: [birth year], 65 y.o.   MRN: [id number]  HPI Sore throat- woke up yesterday AM w/ 'scratchy throat' and cough.  Cough is intermittently productive of pale yellow mucous.  No fevers.  + nasal congestion.  No ear pain.  No known sick contacts.  Denies facial pain/pressure.   Review of Systems For ROS see HPI    Objective:   Physical Exam  Constitutional: She appears well-developed and well-nourished. No distress.  HENT:  Head: Normocephalic and atraumatic.  Right Ear: External ear normal.  Left Ear: External ear normal.       Mildly edematous nasal turbinates, + PND No TTP over sinuses  Eyes: Conjunctivae and EOM are normal. Pupils are equal, round, and reactive to light.  Neck: Normal range of motion. Neck supple.  Pulmonary/Chest: Effort normal and breath sounds normal. No respiratory distress. She has no wheezes. She has no rales.       No cough heard  Lymphadenopathy:    She has no cervical adenopathy.          Assessment & Plan:

## 2010-11-19 ENCOUNTER — Telehealth: Payer: Self-pay | Admitting: *Deleted

## 2010-11-19 MED ORDER — AZITHROMYCIN 250 MG PO TABS: 250.0000 mg | ORAL_TABLET | Freq: Every day | ORAL | Status: AC

## 2010-11-19 NOTE — Telephone Encounter (Signed)
Ok for Azithromycin 250mg - 2 tabs on day 1, 1 tab on day 2-5.  This will treat her upper respiratory symptoms.  If no improvement after meds will need a f/u appt

## 2010-11-19 NOTE — Telephone Encounter (Signed)
Discuss with patient, Rx sent to pharmacy. 

## 2010-11-19 NOTE — Telephone Encounter (Signed)
Pt seen on 11-17-10 and symptoms increasing.  Pt c/o burning in throat, congestion, headache, chills, hoarseness and cough. Pt uses  [facility] [address]

## 2010-12-09 ENCOUNTER — Ambulatory Visit (INDEPENDENT_AMBULATORY_CARE_PROVIDER_SITE_OTHER): Payer: 59 | Admitting: Internal Medicine

## 2010-12-09 ENCOUNTER — Encounter: Payer: Self-pay | Admitting: Internal Medicine

## 2010-12-09 VITALS — BP 130/82 | HR 76 | Temp 98.3°F | Wt 220.8 lb

## 2010-12-09 DIAGNOSIS — M25552 Pain in left hip: Secondary | ICD-10-CM

## 2010-12-09 DIAGNOSIS — M25559 Pain in unspecified hip: Secondary | ICD-10-CM

## 2010-12-09 DIAGNOSIS — D518 Other vitamin B12 deficiency anemias: Secondary | ICD-10-CM

## 2010-12-09 DIAGNOSIS — D519 Vitamin B12 deficiency anemia, unspecified: Secondary | ICD-10-CM

## 2010-12-09 DIAGNOSIS — IMO0002 Reserved for concepts with insufficient information to code with codable children: Secondary | ICD-10-CM

## 2010-12-09 MED ORDER — CYANOCOBALAMIN 1000 MCG/ML IJ SOLN
1000.0000 ug | Freq: Once | INTRAMUSCULAR | Status: AC
  Administered 2010-12-09: 1000 ug via INTRAMUSCULAR

## 2010-12-09 MED ORDER — TRAMADOL HCL 50 MG PO TABS: 50.0000 mg | ORAL_TABLET | Freq: Four times a day (QID) | ORAL | Status: DC | PRN

## 2010-12-09 NOTE — Progress Notes (Signed)
  Subjective:    Patient ID: Dorothy Ritter, female    DOB: [birth year], 65 y.o.   MRN: [id number]  HPI Extremity pain in L hip Onset:12/07/10 Trigger/injury:simply standing up from chair Pain quality:sharp Pain severity:up to a 15  ! Duration:seconds Radiation:occasioanlly to L knee Exacerbating factors:any movement Review of systems: Constitutional: fever, chills, sweats, change in weight :no Musculoskeletal: muscle cramp or pain:no;  joint stiffness, redness, or swelling:no Skin:rash, color change:no Neuro:weakness:L leg with ambulation; incontinence (stool/urine):no; numbness and tingling:no Heme:lymphadenopathy: no; abnormal bleeding or clotting:no Treatment/response:NSAIDS & ASA with some benefit   PMH: no back or hip problems   Review of Systems     Objective:   Physical Exam Gen.: Healthy and well-nourished in appearance; obviously  in discomfort. Lungs: Normal respiratory effort; chest expands symmetrically. Lungs are clear to auscultation without rales, wheezes, or increased work of breathing. Heart: Normal rate and rhythm. Normal S1 and S2. No gallop, click, or rub. Abdomen: Bowel sounds normal; abdomen soft and nontender. No masses, organomegaly or hernias noted. Musculoskeletal/extremities: No deformity or scoliosis noted of  the thoracic or lumbar spine. No clubbing, cyanosis, edema, or deformity noted. Range of motion  Normal. Pain L hip with SLR .Tone & strength  normal.Joints normal. Nail health  Good.Crepitus L > R knee. Limping on L hip with walking. She lay back & sat up w/o help.Slight tenderness to percussion LS spine Vascular: Carotid, radial artery, dorsalis pedis and dorsalis posterior tibial pulses are full and equal. No bruits present. Neurologic: Alert and oriented x3. Deep tendon reflexes symmetrical and 0-1/2+. Heel & toe walking completed.          Skin: Intact without suspicious lesions or rashes. Lymph: No cervical, axillary, or inguinal  lymphadenopathy present. Psych: Mood and affect are normal. Normally interactive                                                                                         Assessment & Plan:  #1hip pain #2PMH Lumbar radiculopathy 2010 Plan: Tramadol 50 mg Imaging & Orthopedic referral if no better

## 2010-12-09 NOTE — Patient Instructions (Signed)
Call as to response to pain meds.

## 2011-01-12 ENCOUNTER — Other Ambulatory Visit: Payer: Self-pay | Admitting: Internal Medicine

## 2011-02-06 ENCOUNTER — Encounter: Payer: Self-pay | Admitting: Internal Medicine

## 2011-02-12 ENCOUNTER — Ambulatory Visit (INDEPENDENT_AMBULATORY_CARE_PROVIDER_SITE_OTHER): Payer: 59 | Admitting: Internal Medicine

## 2011-02-12 ENCOUNTER — Encounter: Payer: Self-pay | Admitting: Internal Medicine

## 2011-02-12 VITALS — BP 130/82 | HR 72 | Temp 98.6°F | Ht 65.5 in | Wt 224.4 lb

## 2011-02-12 DIAGNOSIS — E785 Hyperlipidemia, unspecified: Secondary | ICD-10-CM

## 2011-02-12 DIAGNOSIS — D518 Other vitamin B12 deficiency anemias: Secondary | ICD-10-CM

## 2011-02-12 DIAGNOSIS — E8881 Metabolic syndrome: Secondary | ICD-10-CM

## 2011-02-12 DIAGNOSIS — Z0000 Encounter for general adult medical examination without abnormal findings: Secondary | ICD-10-CM

## 2011-02-12 DIAGNOSIS — I10 Essential (primary) hypertension: Secondary | ICD-10-CM

## 2011-02-12 DIAGNOSIS — E039 Hypothyroidism, unspecified: Secondary | ICD-10-CM

## 2011-02-12 LAB — CBC WITH DIFFERENTIAL/PLATELET
Basophils Relative: 1.2 % (ref 0.0–3.0)
HCT: 37.4 % (ref 36.0–46.0)
Hemoglobin: 12.4 g/dL (ref 12.0–15.0)
Lymphocytes Relative: 40.9 % (ref 12.0–46.0)
Lymphs Abs: 2.5 10*3/uL (ref 0.7–4.0)
MCHC: 33.1 g/dL (ref 30.0–36.0)
Monocytes Relative: 7.6 % (ref 3.0–12.0)
Neutro Abs: 2.8 10*3/uL (ref 1.4–7.7)
RBC: 3.8 Mil/uL — ABNORMAL LOW (ref 3.87–5.11)

## 2011-02-12 LAB — BASIC METABOLIC PANEL
CO2: 29 mEq/L (ref 19–32)
Calcium: 8.5 mg/dL (ref 8.4–10.5)
GFR: 113.2 mL/min (ref >60.00)
Glucose, Bld: 101 mg/dL — ABNORMAL HIGH (ref 70–99)
Potassium: 3.5 mEq/L (ref 3.5–5.1)
Sodium: 140 mEq/L (ref 135–145)

## 2011-02-12 LAB — LIPID PANEL
HDL: 67.4 mg/dL (ref >39.00)
Total CHOL/HDL Ratio: 3
VLDL: 8.6 mg/dL (ref 0.0–40.0)

## 2011-02-12 LAB — HEMOGLOBIN A1C: Hgb A1c MFr Bld: 6.1 % (ref 4.6–6.5)

## 2011-02-12 LAB — LDL CHOLESTEROL, DIRECT: Direct LDL: 132.3 mg/dL

## 2011-02-12 LAB — TSH: TSH: 3.24 u[IU]/mL (ref 0.35–5.50)

## 2011-02-12 LAB — VITAMIN B12: Vitamin B-12: 193 pg/mL — ABNORMAL LOW (ref 211–911)

## 2011-02-12 LAB — HEPATIC FUNCTION PANEL
Bilirubin, Direct: 0.1 mg/dL (ref 0.0–0.3)
Total Bilirubin: 0.6 mg/dL (ref 0.3–1.2)

## 2011-02-12 NOTE — Progress Notes (Signed)
Subjective:    Patient ID: Dorothy Ritter, female    DOB: 1945/10/01, 65 y.o.   MRN: [id number]  HPI Medicare Wellness Visit:  The following psychosocial & medical history were reviewed as required by Medicare.   Social history: caffeine: 2-3 cups coffee/ day , alcohol:  no ,  tobacco use : never  & exercise : aerobics #x/week X 30-60 min.   Home & personal  safety / fall risk: no issues, activities of daily living: no limitations , seatbelt use : yes , and smoke alarm employment : yes .  Power of Attorney/Living Will status : in place  Vision ( as recorded per Nurse) & Hearing  evaluation :  Ophth exam overdue;wall chart read @ 6 ft; whisper heard @ 6 ft. Orientation :oriented X3 , memory & recall :good, spelling testing: good,and mood & affect : normal . Depression / anxiety: denied  Travel history : never , immunization status :Shingles needed , transfusion history:  no, and preventive health surveillance ( colonoscopies, BMD , etc as per protocol/ SOC): all up to date, Dental care:  annually . Chart reviewed &  Updated. Active issues reviewed & addressed.      Review of Systems Patient reports no vision/ hearing  changes, adenopathy,fever, weight change,  persistant / recurrent hoarseness , swallowing issues, chest pain,palpitations,edema,persistant /recurrent cough, hemoptysis, dyspnea( rest/ exertional/paroxysmal nocturnal), gastrointestinal bleeding(melena, rectal bleeding), abdominal pain, significant heartburn,  bowel changes,GU symptoms(dysuria, hematuria,pyuria, incontinence ), Gyn symptoms(abnormal  bleeding , pain),  syncope, focal weakness, memory loss,numbness & tingling, skin/hair /nail changes, or bnormal bruising or bleeding. Intermittent L hip & knee pain , resolution with meds. Off Lipitor X 4 months ; drug store stated no refills     Objective:   Physical Exam Gen.: Healthy and well-nourished in appearance. Alert, appropriate and cooperative throughout exam. Head:  Normocephalic without obvious abnormalities Eyes: No corneal or conjunctival inflammation noted. Pupils equal round reactive to light and accommodation. Fundal exam is benign without hemorrhages, exudate, papilledema. Extraocular motion intact. Arcus senilis Ears: External  ear exam reveals no significant lesions or deformities. Canals clear .TMs normal.  Nose: External nasal exam reveals no deformity or inflammation. Nasal mucosa are pink and moist. No lesions or exudates noted. Mouth: Oral mucosa and oropharynx reveal no lesions or exudates. Teeth in good repair. Neck: No deformities, masses, or tenderness noted. Range of motion &. Thyroid normal. Lungs: Normal respiratory effort; chest expands symmetrically. Lungs are clear to auscultation without rales, wheezes, or increased work of breathing. Heart: Normal rate and rhythm. Normal S1 and S2. No gallop, click, or rub. No  murmur. Abdomen: Bowel sounds normal; abdomen soft and nontender. Protuberant w/o  masses, organomegaly or hernias noted. Genitalia: Dr Garnetta Buddy seen 18 months)   .                                                                                   Musculoskeletal/extremities: No deformity or scoliosis noted of  the thoracic or lumbar spine. No clubbing, cyanosis, edema, or deformity noted. Range of motion  normal .Tone & strength  normal.Joints normal. Nail health  Good. Minor knee crepitus Vascular: Carotid, radial artery, dorsalis pedis and  posterior tibial pulses are full and equal. No bruits present. Neurologic: Alert and oriented x3. Deep tendon reflexes symmetrical and normal.          Skin: Intact without suspicious lesions or rashes. Lymph: No cervical, axillary lymphadenopathy present. Psych: Mood and affect are normal. Normally interactive                                                                                         Assessment & Plan:  #1 Medicare Wellness Exam; criteria met ; data entered #2 Problem  List reviewed ; Assessment/ Recommendations made Plan: see Orders

## 2011-02-12 NOTE — Progress Notes (Signed)
Addended byMarga Melnick F on: 02/12/2011 10:57 AM   Modules accepted: Level of Service

## 2011-02-12 NOTE — Patient Instructions (Signed)
Preventive Health Care: Exercise  30-45  minutes a day, 3-4 days a week. Walking is especially valuable in preventing Osteoporosis. Eat a low-fat diet with lots of fruits and vegetables, up to 7-9 servings per day. Avoid obesity; your goal = waist less than 35 inches.Consume less than 30 grams of sugar per day from foods & drinks with High Fructose Corn Syrup as # 1,2,3 or #4 on label.  White carbohydrates (potatoes, rice, bread, and pasta) have a high spike of sugar and a high load of sugar. For example a  baked potato has a cup of sugar and a  french fry  2 teaspoons of sugar. Yams, wild  rice, whole grained bread &  wheat pasta have been much lower spike and load of  sugar. Portions should be the size of a deck of cards or your palm.

## 2011-02-16 ENCOUNTER — Other Ambulatory Visit: Payer: Self-pay | Admitting: Internal Medicine

## 2011-02-16 DIAGNOSIS — M25552 Pain in left hip: Secondary | ICD-10-CM

## 2011-02-16 MED ORDER — TRAMADOL HCL 50 MG PO TABS: 50.0000 mg | ORAL_TABLET | Freq: Four times a day (QID) | ORAL | Status: DC | PRN

## 2011-02-16 NOTE — Telephone Encounter (Signed)
RX sent to pharmacy  

## 2011-02-18 ENCOUNTER — Telehealth: Payer: Self-pay

## 2011-02-18 MED ORDER — PRAVASTATIN SODIUM 20 MG PO TABS: 20.0000 mg | ORAL_TABLET | Freq: Every day | ORAL | Status: DC

## 2011-02-18 NOTE — Telephone Encounter (Signed)
RX faxed with labs

## 2011-02-18 NOTE — Telephone Encounter (Signed)
Message copied by Edgardo Roys on Wed Feb 18, 2011 10:06 AM ------      Message from: Pecola Lawless      Created: Tue Feb 17, 2011  5:25 PM       Your LDL or BAD cholesterol goal = < 100, ideally < 70. Please consider pravastatin 20 mg at bedtime with repeat fasting labs in 10 weeks (lipids, hepatic panel, CK; 272.4, 995.20). This medicine would cost $10 for 90 pills at [facility] or  [facility], if insurance not used.      The A1c test is used primarily to monitor the glucose control of diabetics over time. The goal of those with diabetes is to keep their blood glucose levels as close to normal as possible. This helps to minimize the complications caused by chronically elevated glucose levels, such as progressive damage to body organs like the kidneys, eyes, cardiovascular system, and nerves. The A1c test gives a picture of the average amount of glucose in the blood over the last few months. It can help a patient and his doctor know if the measures they are taking to control the patient's diabetes are successful or need to be adjusted.        NORMAL VALUES       Non diabetic adults: 5 %-6.1%       Good diabetic control: 6.2-6.4 %       Fair diabetic control: 6.5-7%       Poor diabetic control: greater than 7 % ( except with additional factors such as  advanced age; significant coronary or neurologic disease,etc). Check the A1c every 6 months if it is < 6.5%; every 4 months if  6.5% or higher. Goals for home glucose monitoring are : fasting  or morning glucose goal of  90-150. Two hours after any meal , goal = < 180, preferably < 150.      B12 supplementation should be continued. Please check with your insurance to see it will  cover the nasally inhaled B12 ( Nascobal). [employer]

## 2011-02-24 ENCOUNTER — Other Ambulatory Visit: Payer: Self-pay | Admitting: Internal Medicine

## 2011-02-27 ENCOUNTER — Other Ambulatory Visit: Payer: Self-pay | Admitting: Internal Medicine

## 2011-02-27 MED ORDER — CYANOCOBALAMIN 500 MCG/0.1ML NA SOLN: NASAL | Status: DC

## 2011-02-27 NOTE — Telephone Encounter (Signed)
Pt called wanted to get rx for nascobal sent to pharmacy so she can see what cost would be w/ insurance. Pt aware rx sent to pharmacy.

## 2011-03-19 ENCOUNTER — Telehealth: Payer: Self-pay | Admitting: *Deleted

## 2011-03-19 NOTE — Telephone Encounter (Signed)
Called patient and l/m on voicemail for her to call and schedule colonoscopy.

## 2011-05-19 ENCOUNTER — Telehealth: Payer: Self-pay | Admitting: Internal Medicine

## 2011-05-19 NOTE — Telephone Encounter (Signed)
Left a message for pt to return call 

## 2011-05-19 NOTE — Telephone Encounter (Signed)
A baseline B12 level should be redrawn; it was dramatically low before. This will help determine whether she needs weekly injections x4 in addition to monthly injections thereafter (B12 deficiency) , 281.0

## 2011-05-19 NOTE — Telephone Encounter (Signed)
Pt last B12 level was done on  02-12-11 193 (L) Pt was advise to check with insurance to see if nasal b12 is covered. Please advise would you like to check another level prior to starting injection or if not ok to start with injection and what schedule would you like Pt to follow.

## 2011-05-19 NOTE — Telephone Encounter (Signed)
Pt is aware.  

## 2011-05-26 ENCOUNTER — Ambulatory Visit (INDEPENDENT_AMBULATORY_CARE_PROVIDER_SITE_OTHER): Payer: 59

## 2011-05-26 DIAGNOSIS — Z23 Encounter for immunization: Secondary | ICD-10-CM

## 2011-06-09 ENCOUNTER — Other Ambulatory Visit: Payer: Self-pay | Admitting: Internal Medicine

## 2011-06-11 ENCOUNTER — Other Ambulatory Visit: Payer: Self-pay | Admitting: Internal Medicine

## 2011-06-11 NOTE — Telephone Encounter (Signed)
repeat fasting labs in 10 weeks (lipids, hepatic panel, CK; 272.4, 995.20).  Copied from 02/2011 labs

## 2011-06-18 ENCOUNTER — Encounter: Payer: Self-pay | Admitting: Internal Medicine

## 2011-06-18 ENCOUNTER — Ambulatory Visit (INDEPENDENT_AMBULATORY_CARE_PROVIDER_SITE_OTHER): Payer: 59 | Admitting: Internal Medicine

## 2011-06-18 VITALS — BP 130/78 | HR 78 | Temp 97.9°F | Wt 225.4 lb

## 2011-06-18 DIAGNOSIS — J029 Acute pharyngitis, unspecified: Secondary | ICD-10-CM

## 2011-06-18 DIAGNOSIS — J209 Acute bronchitis, unspecified: Secondary | ICD-10-CM

## 2011-06-18 LAB — POCT RAPID STREP A (OFFICE): Rapid Strep A Screen: NEGATIVE

## 2011-06-18 MED ORDER — HYDROCODONE-HOMATROPINE 5-1.5 MG/5ML PO SYRP: 5.0000 mL | ORAL_SOLUTION | Freq: Four times a day (QID) | ORAL | Status: AC | PRN

## 2011-06-18 MED ORDER — AZITHROMYCIN 250 MG PO TABS: ORAL_TABLET | ORAL | Status: AC

## 2011-06-18 NOTE — Progress Notes (Signed)
  Subjective:    Patient ID: Dorothy Ritter, female    DOB: Mar 03, 1946, 65 y.o.   MRN: [id number]  HPI Respiratory tract infection Onset/symptoms:12/12 as ST Exposures (illness/environmental/extrinsic):co workers are ill Progression of symptoms:to dry cough last night Treatments/response:Mucinex with some benefit Present symptoms: Fever/chills/sweats:yes Frontal headache:no Facial pain:no Nasal purulence:scant Sore throat:yes Dental pain:no Lymphadenopathy:no Wheezing/shortness of breath:only DOE Cough/sputum/hemoptysis:scant dark Associated extrinsic/allergic symptoms:itchy eyes/ sneezing:no Past medical history: Seasonal allergies; no/asthma:no Smoking history:never  She has taken the flu shot           Review of Systems     Objective:   Physical Exam General appearance is of good health and nourishment; no acute distress or increased work of breathing is present.  No  lymphadenopathy about the head, neck, or axilla noted.   Eyes: No conjunctival inflammation or lid edema is present.  Ears:  External ear exam shows no significant lesions or deformities.  Otoscopic examination reveals clear canals, tympanic membranes are intact bilaterally without bulging, retraction, inflammation or discharge.  Nose:  External nasal examination shows no deformity or inflammation. Nasal mucosa are dry without lesions or exudates. No septal dislocation .No obstruction to airflow.   Oral exam: Dental hygiene is good; lips and gums are healthy appearing.There is no oropharyngeal erythema or exudate noted.     Heart:  Normal rate and regular rhythm. S1 and S2 normal without gallop, murmur, click, rub or other extra sounds.   Lungs:Chest :mild rales RLL but  no wheezes, rhonchi,or rubs present.No increased work of breathing.    Extremities:  No cyanosis, edema, or clubbing  noted    Skin: Warm & dry        Assessment & Plan:  #1 respiratory tract infection without criteria of  rhinosinusitis. She does have scant purulent nasal secretions and sputum. Historically a viral process as suggested, but she does exhibit mild rales at the right base.  Plan: See orders and recommendations. I shall cover for atypical organisms because of the walls.

## 2011-06-18 NOTE — Patient Instructions (Signed)
Zicam Melts or Zinc lozenges ; vitamin C 2000 mg daily; & Echinacea for 4-7 days. Report fever, increasing secretions or shortness of  breath.

## 2011-07-30 ENCOUNTER — Ambulatory Visit (INDEPENDENT_AMBULATORY_CARE_PROVIDER_SITE_OTHER): Payer: Self-pay | Admitting: *Deleted

## 2011-07-30 DIAGNOSIS — E538 Deficiency of other specified B group vitamins: Secondary | ICD-10-CM

## 2011-07-30 MED ORDER — CYANOCOBALAMIN 1000 MCG/ML IJ SOLN
1000.0000 ug | Freq: Once | INTRAMUSCULAR | Status: AC
  Administered 2011-07-30: 1000 ug via INTRAMUSCULAR

## 2011-08-28 ENCOUNTER — Ambulatory Visit (INDEPENDENT_AMBULATORY_CARE_PROVIDER_SITE_OTHER): Payer: Self-pay | Admitting: Internal Medicine

## 2011-08-28 ENCOUNTER — Encounter: Payer: Self-pay | Admitting: Internal Medicine

## 2011-08-28 DIAGNOSIS — D518 Other vitamin B12 deficiency anemias: Secondary | ICD-10-CM

## 2011-08-28 DIAGNOSIS — M25559 Pain in unspecified hip: Secondary | ICD-10-CM

## 2011-08-28 DIAGNOSIS — M25551 Pain in right hip: Secondary | ICD-10-CM

## 2011-08-28 MED ORDER — TRAMADOL HCL 50 MG PO TABS: 50.0000 mg | ORAL_TABLET | Freq: Four times a day (QID) | ORAL | Status: DC | PRN
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
## 2011-08-28 NOTE — Progress Notes (Signed)
Addended by: Silvio Pate D on: 08/28/2011 05:02 PM   Modules accepted: Orders

## 2011-08-28 NOTE — Patient Instructions (Signed)
Soaking in hot bath twice a day may help the discomfort in the hip. Orthopedic referral is needed for pain persists despite the medication.

## 2011-08-31 ENCOUNTER — Encounter: Payer: Self-pay | Admitting: Internal Medicine

## 2011-08-31 ENCOUNTER — Ambulatory Visit (INDEPENDENT_AMBULATORY_CARE_PROVIDER_SITE_OTHER): Payer: Self-pay | Admitting: Internal Medicine

## 2011-08-31 VITALS — BP 120/84 | HR 74 | Temp 98.2°F

## 2011-08-31 DIAGNOSIS — M25559 Pain in unspecified hip: Secondary | ICD-10-CM

## 2011-08-31 DIAGNOSIS — M25551 Pain in right hip: Secondary | ICD-10-CM

## 2011-08-31 MED ORDER — HYDROCODONE-ACETAMINOPHEN 7.5-500 MG PO TABS: 1.0000 | ORAL_TABLET | ORAL | Status: DC | PRN

## 2011-08-31 MED ORDER — GABAPENTIN 100 MG PO CAPS: 100.0000 mg | ORAL_CAPSULE | Freq: Three times a day (TID) | ORAL | Status: DC

## 2011-08-31 NOTE — Patient Instructions (Signed)
.  Share results with Orthopedic speciallist

## 2011-08-31 NOTE — Progress Notes (Signed)
  Subjective:    Patient ID: Dorothy Ritter, female    DOB: Apr 20, 1946, 66 y.o.   MRN: [id number]  HPI Her right hip and upper thigh pain persists &  is only 20% better with the tramadol; it is described as "burning". It is worse when she tries to stand up or ambulate. There is no associated back pain and there is no radiation below the knee. She denies any incontinence of urine and stool. She has not developed a rash with this. She is now unable to lie in the right lateral decubitus position    Review of Systems     Objective:   Physical Exam   She is in a wheelchair and is uncomfortable even sitting standing.  No rashes noted over the right flank/lower back.  There is tenderness to deep palpation over the right upper buttock  Deep tendon reflexes are equal bilaterally. Strength and tone are normal to opposition. Straight leg raising appears to be negative        Assessment & Plan:  #1 hip and thigh pain; bursitis or joint pain suspect rather than lumbar radicular pain despite the history of "burning" character.  Plan: Pain medication will be increased in strength and referral made to an orthopedist. Gabapentin milligrams every 8 hours will be provided as a trial. If there were dramatic response to this concern would be for lumbar radicular pain.

## 2011-09-10 ENCOUNTER — Other Ambulatory Visit: Payer: Self-pay | Admitting: *Deleted

## 2011-09-10 ENCOUNTER — Other Ambulatory Visit: Payer: Self-pay | Admitting: Internal Medicine

## 2011-09-10 DIAGNOSIS — M25551 Pain in right hip: Secondary | ICD-10-CM

## 2011-09-10 MED ORDER — HYDROCODONE-ACETAMINOPHEN 7.5-500 MG PO TABS: 1.0000 | ORAL_TABLET | ORAL | Status: AC | PRN

## 2011-09-10 NOTE — Telephone Encounter (Signed)
Discussed with patient and gave her the number to Ortho to follow up.    KP

## 2011-09-10 NOTE — Telephone Encounter (Signed)
08-31-11 Last OV, Last filled #30

## 2011-09-10 NOTE — Telephone Encounter (Signed)
She was supposed to see ortho and refused original appointment ---- she needs to see ortho---I will refill 1x

## 2011-09-11 NOTE — Telephone Encounter (Signed)
This is complete.

## 2011-09-29 ENCOUNTER — Other Ambulatory Visit: Payer: Self-pay | Admitting: Internal Medicine

## 2011-09-29 ENCOUNTER — Other Ambulatory Visit: Payer: Self-pay | Admitting: Family Medicine

## 2011-09-30 NOTE — Telephone Encounter (Signed)
Dr.Hopper please advise on refill request 

## 2011-09-30 NOTE — Telephone Encounter (Signed)
Dr. Nolen Mu should  prescribe all  pain medicine as he has evaluated her orthopedic issues

## 2011-09-30 NOTE — Telephone Encounter (Signed)
Lipid/Hep 272.4/995.20  

## 2011-10-01 NOTE — Telephone Encounter (Signed)
Discuss with patient  

## 2011-10-12 ENCOUNTER — Telehealth: Payer: Self-pay | Admitting: Internal Medicine

## 2011-10-12 NOTE — Telephone Encounter (Signed)
Spoke with patient, per Dr.Hopper patient needs appointment to complete paperwork. Patient scheduled appointment for tomorrow

## 2011-10-13 ENCOUNTER — Ambulatory Visit: Payer: Self-pay | Admitting: Internal Medicine

## 2011-11-12 ENCOUNTER — Other Ambulatory Visit: Payer: Self-pay | Admitting: Internal Medicine

## 2011-11-17 ENCOUNTER — Encounter: Payer: Self-pay | Admitting: Internal Medicine

## 2011-11-17 ENCOUNTER — Ambulatory Visit (INDEPENDENT_AMBULATORY_CARE_PROVIDER_SITE_OTHER): Payer: 59 | Admitting: Internal Medicine

## 2011-11-17 VITALS — BP 132/82 | HR 71 | Temp 97.9°F | Wt 223.0 lb

## 2011-11-17 DIAGNOSIS — J40 Bronchitis, not specified as acute or chronic: Secondary | ICD-10-CM

## 2011-11-17 MED ORDER — AMOXICILLIN 500 MG PO CAPS: 1000.0000 mg | ORAL_CAPSULE | Freq: Two times a day (BID) | ORAL | Status: AC

## 2011-11-17 NOTE — Patient Instructions (Signed)
Rest, fluids , tylenol For cough, take Mucinex DM twice a day as needed   Take the antibiotic as prescribed  (Amoxicillin) Call if no better in few days Call anytime if the symptoms are severe  
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
## 2011-12-19 ENCOUNTER — Other Ambulatory Visit: Payer: Self-pay | Admitting: Internal Medicine

## 2012-01-21 ENCOUNTER — Telehealth: Payer: Self-pay | Admitting: Internal Medicine

## 2012-01-21 ENCOUNTER — Other Ambulatory Visit: Payer: Self-pay | Admitting: Internal Medicine

## 2012-01-21 NOTE — Telephone Encounter (Signed)
Patient states she needs her adoption papers. In looking through the chart, I see that she was requested to come in for an OV to have them filled out and never came to that appointment. I advised patient that she will need to see Dr. Alwyn Ren, but I want to make sure we still have the paperwork, as this is from April. Please let me know if we still have the papers. I can then call the patient back and schedule an appointment for her.

## 2012-01-21 NOTE — Telephone Encounter (Signed)
I have patient's paper on the counter behind my desk. OK to schedule appointment

## 2012-01-21 NOTE — Telephone Encounter (Signed)
lmovm

## 2012-01-22 NOTE — Telephone Encounter (Signed)
Pt coming in Monday 01/25/12 to have adoption papers filled out.

## 2012-01-25 ENCOUNTER — Encounter: Payer: Self-pay | Admitting: Internal Medicine

## 2012-01-25 ENCOUNTER — Ambulatory Visit (INDEPENDENT_AMBULATORY_CARE_PROVIDER_SITE_OTHER): Payer: 59 | Admitting: Internal Medicine

## 2012-01-25 VITALS — BP 130/78 | HR 92 | Temp 98.3°F | Ht 65.03 in | Wt 229.0 lb

## 2012-01-25 DIAGNOSIS — E039 Hypothyroidism, unspecified: Secondary | ICD-10-CM

## 2012-01-25 DIAGNOSIS — R739 Hyperglycemia, unspecified: Secondary | ICD-10-CM | POA: Insufficient documentation

## 2012-01-25 DIAGNOSIS — R7309 Other abnormal glucose: Secondary | ICD-10-CM

## 2012-01-25 DIAGNOSIS — D518 Other vitamin B12 deficiency anemias: Secondary | ICD-10-CM

## 2012-01-25 DIAGNOSIS — I10 Essential (primary) hypertension: Secondary | ICD-10-CM

## 2012-01-25 DIAGNOSIS — E785 Hyperlipidemia, unspecified: Secondary | ICD-10-CM

## 2012-01-25 DIAGNOSIS — Z111 Encounter for screening for respiratory tuberculosis: Secondary | ICD-10-CM

## 2012-01-25 LAB — CBC WITH DIFFERENTIAL/PLATELET
Basophils Relative: 1.6 % (ref 0.0–3.0)
Eosinophils Absolute: 0.2 10*3/uL (ref 0.0–0.7)
Hemoglobin: 12.3 g/dL (ref 12.0–15.0)
Lymphocytes Relative: 45.1 % (ref 12.0–46.0)
MCHC: 32.5 g/dL (ref 30.0–36.0)
Monocytes Relative: 7 % (ref 3.0–12.0)
Neutro Abs: 2.4 10*3/uL (ref 1.4–7.7)
RBC: 3.83 Mil/uL — ABNORMAL LOW (ref 3.87–5.11)

## 2012-01-25 LAB — HEPATIC FUNCTION PANEL
ALT: 19 U/L (ref 0–35)
AST: 20 U/L (ref 0–37)
Alkaline Phosphatase: 65 U/L (ref 39–117)
Bilirubin, Direct: 0 mg/dL (ref 0.0–0.3)
Total Protein: 7.5 g/dL (ref 6.0–8.3)

## 2012-01-25 LAB — BASIC METABOLIC PANEL
CO2: 29 mEq/L (ref 19–32)
Calcium: 8.5 mg/dL (ref 8.4–10.5)
Sodium: 140 mEq/L (ref 135–145)

## 2012-01-25 LAB — LIPID PANEL
Cholesterol: 213 mg/dL — ABNORMAL HIGH (ref 0–200)
HDL: 69.6 mg/dL (ref >39.00)
VLDL: 11.6 mg/dL (ref 0.0–40.0)

## 2012-01-25 NOTE — Assessment & Plan Note (Signed)
Last lipids revealed an LDL of 132.3 with HDL of 67 on 418/9/12. Fasting lipids today

## 2012-01-25 NOTE — Assessment & Plan Note (Signed)
TSH 3.24 on 02/12/11; repeat TSH

## 2012-01-25 NOTE — Assessment & Plan Note (Signed)
Blood pressure well controlled today; blood pressure goals discussed

## 2012-01-25 NOTE — Patient Instructions (Addendum)
Preventive Health Care: Exercise  30-45  minutes a day, 3-4 days a week. Walking is especially valuable in preventing Osteoporosis. Eat a low-fat diet with lots of fruits and vegetables, up to 7-9 servings per day.  Consume less than 30 grams of sugar per day from foods & drinks with High Fructose Corn Syrup as #1,2,3 or #4 on label. As per the Standard of Care , screening Colonoscopy recommended @ 50 & every 5-10 years thereafter . More frequent monitor would be dictated by family history or findings @ Colonoscopy  Eye Doctor - have an eye exam @ least annually Health Care Power of Attorney & Living Will place you in charge of your health care  decisions. Verify these are  in place. Please try to go on My Chart within the next 24 hours to allow me to release the results directly to you.

## 2012-01-25 NOTE — Assessment & Plan Note (Signed)
CBC and differential as well as B12 levels will be checked. She's been off the B12 supplementation for 2 months

## 2012-01-25 NOTE — Progress Notes (Signed)
  Subjective:    Patient ID: Dorothy Ritter, female    DOB: 1946-01-23, 66 y.o.   MRN: [id number]  HPI She's been off all B12 supplementation, both nasally and parenterally , for 2 months. Her last B12 level was 44712/22/13. Over the last 3 years it has varied from 166-447.  She is applying to be a foster parent for one of her grandchildren; she's brought the form in for completion    Review of Systems HYPERTENSION: Disease Monitoring: Blood pressure range-not monitored Chest pain, palpitations- no       Dyspnea- no Medications: Compliance- yes  Lightheadedness,Syncope-no    Edema- no  FASTING HYPERGLYCEMIA, PMH of: FBS 101 on 02/12/11; A1c 6.1% Disease Monitoring: Blood Sugar ranges-not monitored Polyuria/phagia/dipsia- no      Visual problems-no Diet:decreased portions  HYPERLIPIDEMIA: Disease Monitoring: See symptoms for Hypertension Medications: Compliance- yes  Abd pain, bowel changes- no ; Note: colonoscopy due   Muscle aches- no        Objective:   Physical Exam Gen.:  well-nourished in appearance. Alert, appropriate and cooperative throughout exam. Head: Normocephalic without obvious abnormalities  Eyes: No corneal or conjunctival inflammation noted. Ptosis OS. Arcus bilaterally. Extraocular motion intact. No lid lag Ears:  Hearing is grossly normal bilaterally. Nose: External nasal exam reveals no deformity or inflammation. Nasal mucosa are pink and moist. No lesions or exudates noted.   Mouth: Oral mucosa and oropharynx reveal no lesions or exudates. Localized upper caries. Neck: No deformities, masses, or tenderness noted. Range of motion normal Thyroid absent Lungs: Normal respiratory effort; chest expands symmetrically. Lungs are clear to auscultation without rales, wheezes, or increased work of breathing. Heart: Normal rate and rhythm. Normal S1 and S2. No gallop, click, or rub. S4 w/o murmur. Abdomen: Bowel sounds normal; abdomen soft and nontender. No  masses, organomegaly or hernias noted. Genitalia:as per Dr Rana Snare                                                                                 Musculoskeletal/extremities: Lordosis noted of  the thoracic  spine. No clubbing, cyanosis, edema noted. Range of motion  normal .Tone & strength  Normal.Joints:mild DIP fusiform changes. Nail health  good. Vascular: Carotid, radial artery, dorsalis pedis and  posterior tibial pulses are full and equal. No bruits present. Neurologic: Alert and oriented x3. Deep tendon reflexes symmetrical but 0-1/2+          Skin: Intact without suspicious lesions or rashes. Lymph: No cervical, axillary lymphadenopathy present. Psych: Mood and affect are normal. Normally interactive                                                                                         Assessment & Plan:

## 2012-01-27 LAB — TB SKIN TEST: Induration: 0 mm

## 2012-01-29 ENCOUNTER — Encounter: Payer: Self-pay | Admitting: Internal Medicine

## 2012-03-11 ENCOUNTER — Other Ambulatory Visit: Payer: Self-pay | Admitting: Internal Medicine

## 2012-03-11 DIAGNOSIS — E039 Hypothyroidism, unspecified: Secondary | ICD-10-CM

## 2012-03-11 NOTE — Telephone Encounter (Signed)
Dr.Hopper please advise on patient's labs from 01/25/12, I will then be able to fill Cholesterol and Thyroid medications based on your response

## 2012-03-11 NOTE — Telephone Encounter (Signed)
Please note per Alwyn Ren pt

## 2012-03-11 NOTE — Telephone Encounter (Signed)
TSH is too high; other labs are excellent. Copy will be mailed. Increase thyroid to 1&1/2 pills Tuesday, Thursday, and Saturday. Repeat TSH in 10 weeks. Code: 244.9.  Refill all meds x6 months

## 2012-05-30 ENCOUNTER — Ambulatory Visit (INDEPENDENT_AMBULATORY_CARE_PROVIDER_SITE_OTHER): Payer: 59 | Admitting: Family Medicine

## 2012-05-30 ENCOUNTER — Encounter (INDEPENDENT_AMBULATORY_CARE_PROVIDER_SITE_OTHER): Payer: 59

## 2012-05-30 ENCOUNTER — Encounter: Payer: Self-pay | Admitting: Family Medicine

## 2012-05-30 VITALS — BP 144/86 | HR 82 | Temp 97.9°F | Wt 235.2 lb

## 2012-05-30 DIAGNOSIS — M79609 Pain in unspecified limb: Secondary | ICD-10-CM

## 2012-05-30 DIAGNOSIS — M79669 Pain in unspecified lower leg: Secondary | ICD-10-CM

## 2012-05-30 DIAGNOSIS — M7989 Other specified soft tissue disorders: Secondary | ICD-10-CM

## 2012-05-30 DIAGNOSIS — R609 Edema, unspecified: Secondary | ICD-10-CM

## 2012-05-30 MED ORDER — HYDROCHLOROTHIAZIDE 25 MG PO TABS: 25.0000 mg | ORAL_TABLET | Freq: Every day | ORAL | Status: DC

## 2012-05-30 NOTE — Progress Notes (Signed)
  Subjective:    Patient ID: Dorothy Ritter, female    DOB: 02-Jul-1946, 66 y.o.   MRN: [id number]  HPI Pt here c/o R foot and low leg swelling with calf pain x several days.  No cp, no sob.  No known injury.  No recent travel.    Review of Systems    as above Objective:   Physical Exam  Constitutional: She is oriented to person, place, and time. She appears well-developed and well-nourished.  Neck: Normal range of motion. Neck supple.  Cardiovascular: Normal rate, regular rhythm and normal heart sounds.   Pulmonary/Chest: Effort normal and breath sounds normal. No respiratory distress. She has no wheezes. She has no rales.  Musculoskeletal: She exhibits edema and tenderness.       +1 pitting edema RLE +calf pain and knot felt R calf  Neurological: She is alert and oriented to person, place, and time.  Psychiatric: She has a normal mood and affect. Her behavior is normal. Judgment and thought content normal.   Filed Vitals:   05/30/12 1505  BP: 144/86  Pulse: 82  Temp: 97.9 F (36.6 C)  .      Assessment & Plan:  Calf pain and swelling ---inc hctz 25 mg qd                                        Venous doppler stat                                        Keep legs elevated                                         F/u 2 weeks or sooner prn

## 2012-05-30 NOTE — Patient Instructions (Signed)
Edema Edema is an abnormal build-up of fluids in tissues. Because this is partly dependent on gravity (water flows to the lowest place), it is more common in the legs and thighs (lower extremities). It is also common in the looser tissues, like around the eyes. Painless swelling of the feet and ankles is common and increases as a person ages. It may affect both legs and may include the calves or even thighs. When squeezed, the fluid may move out of the affected area and may leave a dent for a few moments. CAUSES   Prolonged standing or sitting in one place for extended periods of time. Movement helps pump tissue fluid into the veins, and absence of movement prevents this, resulting in edema.  Varicose veins. The valves in the veins do not work as well as they should. This causes fluid to leak into the tissues.  Fluid and salt overload.  Injury, burn, or surgery to the leg, ankle, or foot, may damage veins and allow fluid to leak out.  Sunburn damages vessels. Leaky vessels allow fluid to go out into the sunburned tissues.  Allergies (from insect bites or stings, medications or chemicals) cause swelling by allowing vessels to become leaky.  Protein in the blood helps keep fluid in your vessels. Low protein, as in malnutrition, allows fluid to leak out.  Hormonal changes, including pregnancy and menstruation, cause fluid retention. This fluid may leak out of vessels and cause edema.  Medications that cause fluid retention. Examples are sex hormones, blood pressure medications, steroid treatment, or anti-depressants.  Some illnesses cause edema, especially heart failure, kidney disease, or liver disease.  Surgery that cuts veins or lymph nodes, such as surgery done for the heart or for breast cancer, may result in edema. DIAGNOSIS  Your caregiver is usually easily able to determine what is causing your swelling (edema) by simply asking what is wrong (getting a history) and examining you (doing  a physical). Sometimes x-rays, EKG (electrocardiogram or heart tracing), and blood work may be done to evaluate for underlying medical illness. TREATMENT  General treatment includes:  Leg elevation (or elevation of the affected body part).  Restriction of fluid intake.  Prevention of fluid overload.  Compression of the affected body part. Compression with elastic bandages or support stockings squeezes the tissues, preventing fluid from entering and forcing it back into the blood vessels.  Diuretics (also called water pills or fluid pills) pull fluid out of your body in the form of increased urination. These are effective in reducing the swelling, but can have side effects and must be used only under your caregiver's supervision. Diuretics are appropriate only for some types of edema. The specific treatment can be directed at any underlying causes discovered. Heart, liver, or kidney disease should be treated appropriately. HOME CARE INSTRUCTIONS   Elevate the legs (or affected body part) above the level of the heart, while lying down.  Avoid sitting or standing still for prolonged periods of time.  Avoid putting anything directly under the knees when lying down, and do not wear constricting clothing or garters on the upper legs.  Exercising the legs causes the fluid to work back into the veins and lymphatic channels. This may help the swelling go down.  The pressure applied by elastic bandages or support stockings can help reduce ankle swelling.  A low-salt diet may help reduce fluid retention and decrease the ankle swelling.  Take any medications exactly as prescribed. SEEK MEDICAL CARE IF:  Your edema is   not responding to recommended treatments. SEEK IMMEDIATE MEDICAL CARE IF:   You develop shortness of breath or chest pain.  You cannot breathe when you lay down; or if, while lying down, you have to get up and go to the window to get your breath.  You are having increasing  swelling without relief from treatment.  You develop a fever over 102 F (38.9 C).  You develop pain or redness in the areas that are swollen.  Tell your caregiver right away if you have gained 3 lb/1.4 kg in 1 day or 5 lb/2.3 kg in a week. MAKE SURE YOU:   Understand these instructions.  Will watch your condition.  Will get help right away if you are not doing well or get worse. Document Released: 06/22/2005 Document Revised: 12/22/2011 Document Reviewed: 02/08/2008 [employer] Patient Information 2013 [employer].  

## 2012-06-09 ENCOUNTER — Encounter: Payer: Self-pay | Admitting: Internal Medicine

## 2012-06-09 ENCOUNTER — Ambulatory Visit (INDEPENDENT_AMBULATORY_CARE_PROVIDER_SITE_OTHER): Payer: 59 | Admitting: Internal Medicine

## 2012-06-09 VITALS — BP 126/90 | HR 78 | Temp 98.3°F | Wt 235.4 lb

## 2012-06-09 DIAGNOSIS — M76899 Other specified enthesopathies of unspecified lower limb, excluding foot: Secondary | ICD-10-CM

## 2012-06-09 DIAGNOSIS — M7070 Other bursitis of hip, unspecified hip: Secondary | ICD-10-CM

## 2012-06-09 DIAGNOSIS — J209 Acute bronchitis, unspecified: Secondary | ICD-10-CM

## 2012-06-09 MED ORDER — AZITHROMYCIN 250 MG PO TABS: ORAL_TABLET | ORAL | Status: DC

## 2012-06-09 MED ORDER — TRAMADOL HCL 50 MG PO TABS: 50.0000 mg | ORAL_TABLET | Freq: Three times a day (TID) | ORAL | Status: DC | PRN

## 2012-06-09 NOTE — Patient Instructions (Addendum)
Plain Mucinex for thick secretions ;force NON dairy fluids . Use a Neti pot daily as needed for sinus congestion; going from open side to congested side . Nasal cleansing in the shower as discussed. Make sure that all residual soap is removed to prevent irritation.  Plain Allegra 160 daily as needed for itchy eyes & sneezing.

## 2012-06-09 NOTE — Progress Notes (Signed)
  Subjective:    Patient ID: Dorothy Ritter, female    DOB: [birth year], 66 y.o.   MRN: [id number]  HPI Symptoms began 05/30/12 as chills, low-grade fever, sweats associated with a dry cough. As of 11/26 she had a scratchy throat and malaise. She had partial response to Mucinex and Robitussin-DM.  On 06/08/12 there was exacerbation or progression of the sore throat.  She has had some yellow sputum from her throat but denies nasal purulence. With  cough she's had some shortness of breath and wheezing. She has no history of asthma has never smoked    Review of Systems She has some head congestion but denies frontal headache or facial pain. She's had no extrinsic symptoms of itchy, watery eyes or sneezing.  She's had intermittent right hip bursa pain for which she's taken tramadol with good response     Objective:   Physical Exam General appearance:good health ;well nourished; no acute distress or increased work of breathing is present.  No  lymphadenopathy about the head, neck, or axilla noted.   Eyes: No conjunctival inflammation or lid edema is present.  Ears:  External ear exam shows no significant lesions or deformities.  Otoscopic examination reveals clear canals, tympanic membranes are intact bilaterally without bulging, retraction, inflammation or discharge.  Nose:  External nasal examination shows no deformity or inflammation. Nasal mucosa are pink and moist without lesions or exudates. No septal dislocation or deviation.No obstruction to airflow.   Oral exam: Dental hygiene is good; lips and gums are healthy appearing.There is no oropharyngeal erythema or exudate noted.   Neck:  No deformities, masses, or tenderness noted.    Heart:  Normal rate and regular rhythm. S1 and S2 normal without gallop, murmur, click, rub or other extra sounds.   Lungs: Minimal isolated rhonchi RLL initially; no wheezes,rales ,or rubs present.No increased work of breathing.    Extremities:  No  cyanosis, edema, or clubbing  noted    Skin: Warm & dry           Assessment & Plan:  #1 acute bronchitis w/o bronchospasm #2 hip bursa pain Plan: See orders and recommendations

## 2012-09-08 ENCOUNTER — Encounter: Payer: Self-pay | Admitting: Family Medicine

## 2012-09-08 ENCOUNTER — Ambulatory Visit (INDEPENDENT_AMBULATORY_CARE_PROVIDER_SITE_OTHER): Payer: 59 | Admitting: Family Medicine

## 2012-09-08 VITALS — BP 138/82 | HR 78 | Temp 97.1°F | Wt 236.6 lb

## 2012-09-08 DIAGNOSIS — R609 Edema, unspecified: Secondary | ICD-10-CM

## 2012-09-08 MED ORDER — FUROSEMIDE 20 MG PO TABS: 20.0000 mg | ORAL_TABLET | Freq: Two times a day (BID) | ORAL | Status: DC

## 2012-09-08 NOTE — Patient Instructions (Signed)
Edema Edema is an abnormal build-up of fluids in tissues. Because this is partly dependent on gravity (water flows to the lowest place), it is more common in the legs and thighs (lower extremities). It is also common in the looser tissues, like around the eyes. Painless swelling of the feet and ankles is common and increases as a person ages. It may affect both legs and may include the calves or even thighs. When squeezed, the fluid may move out of the affected area and may leave a dent for a few moments. CAUSES   Prolonged standing or sitting in one place for extended periods of time. Movement helps pump tissue fluid into the veins, and absence of movement prevents this, resulting in edema.  Varicose veins. The valves in the veins do not work as well as they should. This causes fluid to leak into the tissues.  Fluid and salt overload.  Injury, burn, or surgery to the leg, ankle, or foot, may damage veins and allow fluid to leak out.  Sunburn damages vessels. Leaky vessels allow fluid to go out into the sunburned tissues.  Allergies (from insect bites or stings, medications or chemicals) cause swelling by allowing vessels to become leaky.  Protein in the blood helps keep fluid in your vessels. Low protein, as in malnutrition, allows fluid to leak out.  Hormonal changes, including pregnancy and menstruation, cause fluid retention. This fluid may leak out of vessels and cause edema.  Medications that cause fluid retention. Examples are sex hormones, blood pressure medications, steroid treatment, or anti-depressants.  Some illnesses cause edema, especially heart failure, kidney disease, or liver disease.  Surgery that cuts veins or lymph nodes, such as surgery done for the heart or for breast cancer, may result in edema. DIAGNOSIS  Your caregiver is usually easily able to determine what is causing your swelling (edema) by simply asking what is wrong (getting a history) and examining you (doing  a physical). Sometimes x-rays, EKG (electrocardiogram or heart tracing), and blood work may be done to evaluate for underlying medical illness. TREATMENT  General treatment includes:  Leg elevation (or elevation of the affected body part).  Restriction of fluid intake.  Prevention of fluid overload.  Compression of the affected body part. Compression with elastic bandages or support stockings squeezes the tissues, preventing fluid from entering and forcing it back into the blood vessels.  Diuretics (also called water pills or fluid pills) pull fluid out of your body in the form of increased urination. These are effective in reducing the swelling, but can have side effects and must be used only under your caregiver's supervision. Diuretics are appropriate only for some types of edema. The specific treatment can be directed at any underlying causes discovered. Heart, liver, or kidney disease should be treated appropriately. HOME CARE INSTRUCTIONS   Elevate the legs (or affected body part) above the level of the heart, while lying down.  Avoid sitting or standing still for prolonged periods of time.  Avoid putting anything directly under the knees when lying down, and do not wear constricting clothing or garters on the upper legs.  Exercising the legs causes the fluid to work back into the veins and lymphatic channels. This may help the swelling go down.  The pressure applied by elastic bandages or support stockings can help reduce ankle swelling.  A low-salt diet may help reduce fluid retention and decrease the ankle swelling.  Take any medications exactly as prescribed. SEEK MEDICAL CARE IF:  Your edema is   not responding to recommended treatments. SEEK IMMEDIATE MEDICAL CARE IF:   You develop shortness of breath or chest pain.  You cannot breathe when you lay down; or if, while lying down, you have to get up and go to the window to get your breath.  You are having increasing  swelling without relief from treatment.  You develop a fever over 102 F (38.9 C).  You develop pain or redness in the areas that are swollen.  Tell your caregiver right away if you have gained 3 lb/1.4 kg in 1 day or 5 lb/2.3 kg in a week. MAKE SURE YOU:   Understand these instructions.  Will watch your condition.  Will get help right away if you are not doing well or get worse. Document Released: 06/22/2005 Document Revised: 12/22/2011 Document Reviewed: 02/08/2008 [employer] Patient Information 2013 [employer].  

## 2012-09-08 NOTE — Progress Notes (Signed)
  Subjective:    Dorothy Ritter is a 67 y.o. female who presents for evaluation of edema in the right lower leg. The edema has been moderate. Onset of symptoms was several months ago, and patient reports symptoms have gradually worsened since that time. The edema is present all day. The patient states the problem is long-standing. The swelling has been aggravated by dependency of involved area. The swelling has been relieved by elevation of involved area. Associated factors include: calf pain--see previous visits. Cardiac risk factors: advanced age (older than 27 for men, 54 for women), hypertension and sedentary lifestyle.  The following portions of the patient's history were reviewed and updated as appropriate: allergies, current medications, past family history, past medical history, past social history, past surgical history and problem list.  Review of Systems Pertinent items are noted in HPI.   Objective:    BP 138/82  Pulse 78  Temp(Src) 97.1 F (36.2 C) (Tympanic)  Wt 236 lb 9.6 oz (107.321 kg)  BMI 39.32 kg/m2  SpO2 97% General appearance: alert, cooperative, appears stated age and no distress Neck: no adenopathy, supple, symmetrical, trachea midline and thyroid not enlarged, symmetric, no tenderness/mass/nodules Lungs: clear to auscultation bilaterally Heart: S1, S2 normal Extremities: edema R low ext--+2 pitting   Cardiographics ECG: not done  Imaging Chest x-ray: not indicated   Assessment:     Edema .    Plan:    Recommendations: decrease sodium in the diet, elevate feet above the level of the heart whenever possible, increase physical activity, use of compression stockings and weight loss. The patient was also instructed to call IMMEDIATELY (i.e., day or night) if any cardiopulmonary symptoms occur, especially chest pain, shortness of breath, dyspnea on exertion, paroxysmal nocturnal dyspnea, or orthopnea, and these were explained. Follow up in 2 weeks and as  needed.  Change hctz to lasix

## 2012-10-05 ENCOUNTER — Other Ambulatory Visit: Payer: 59

## 2012-10-05 ENCOUNTER — Other Ambulatory Visit (INDEPENDENT_AMBULATORY_CARE_PROVIDER_SITE_OTHER): Payer: 59

## 2012-10-05 DIAGNOSIS — R609 Edema, unspecified: Secondary | ICD-10-CM

## 2012-10-05 LAB — CBC WITH DIFFERENTIAL/PLATELET
Basophils Absolute: 0.1 10*3/uL (ref 0.0–0.1)
Eosinophils Absolute: 0.2 10*3/uL (ref 0.0–0.7)
Hemoglobin: 12.3 g/dL (ref 12.0–15.0)
Lymphocytes Relative: 40.3 % (ref 12.0–46.0)
MCHC: 33.2 g/dL (ref 30.0–36.0)
MCV: 100.4 fl — ABNORMAL HIGH (ref 78.0–100.0)
Monocytes Absolute: 0.5 10*3/uL (ref 0.1–1.0)
Neutro Abs: 2.8 10*3/uL (ref 1.4–7.7)
Neutrophils Relative %: 46.8 % (ref 43.0–77.0)
RDW: 15 % — ABNORMAL HIGH (ref 11.5–14.6)

## 2012-10-05 LAB — BASIC METABOLIC PANEL
CO2: 30 mEq/L (ref 19–32)
Calcium: 8.5 mg/dL (ref 8.4–10.5)
Chloride: 102 mEq/L (ref 96–112)
Creatinine, Ser: 0.7 mg/dL (ref 0.4–1.2)
Glucose, Bld: 100 mg/dL — ABNORMAL HIGH (ref 70–99)

## 2012-10-05 LAB — HEPATIC FUNCTION PANEL
AST: 18 U/L (ref 0–37)
Alkaline Phosphatase: 59 U/L (ref 39–117)
Bilirubin, Direct: 0 mg/dL (ref 0.0–0.3)
Total Bilirubin: 0.4 mg/dL (ref 0.3–1.2)

## 2012-10-05 LAB — MICROALBUMIN / CREATININE URINE RATIO: Microalb Creat Ratio: 0.9 mg/g (ref 0.0–30.0)

## 2012-10-05 LAB — LIPID PANEL: Total CHOL/HDL Ratio: 4

## 2012-10-12 ENCOUNTER — Telehealth: Payer: Self-pay | Admitting: Internal Medicine

## 2012-10-12 ENCOUNTER — Other Ambulatory Visit: Payer: Self-pay | Admitting: Internal Medicine

## 2012-10-12 DIAGNOSIS — E039 Hypothyroidism, unspecified: Secondary | ICD-10-CM

## 2012-10-12 MED ORDER — LEVOTHYROXINE SODIUM 75 MCG PO TABS: ORAL_TABLET | ORAL | Status: DC

## 2012-10-12 NOTE — Telephone Encounter (Signed)
Rx sent, Letter Mail  

## 2012-10-12 NOTE — Telephone Encounter (Signed)
SYNTHROID 75 MCG TABLET QTY: 36 TAKE 1 TABLET BY MOUTH DILY EXCEPT 1 1/2 EVERY TUESDAY, Thursday, AND Saturday RECHECK LABS IN 10 WEEKS

## 2012-10-12 NOTE — Telephone Encounter (Signed)
Last TSH was 1.21, 8 months ago TSH was 6.18, when should patient recheck TSH? (information needed to know how many refills to give on thyroid medication

## 2012-10-12 NOTE — Telephone Encounter (Signed)
#  90 ; recheck TSH in 10-12 weeks (Code: 244.9)

## 2012-10-13 ENCOUNTER — Other Ambulatory Visit: Payer: Self-pay

## 2012-10-13 MED ORDER — POTASSIUM CHLORIDE ER 8 MEQ PO TBCR: EXTENDED_RELEASE_TABLET | ORAL | Status: DC

## 2012-10-13 MED ORDER — PRAVASTATIN SODIUM 40 MG PO TABS: ORAL_TABLET | ORAL | Status: DC

## 2012-11-01 ENCOUNTER — Telehealth: Payer: Self-pay | Admitting: Family Medicine

## 2012-11-01 DIAGNOSIS — R609 Edema, unspecified: Secondary | ICD-10-CM

## 2012-11-01 DIAGNOSIS — M79661 Pain in right lower leg: Secondary | ICD-10-CM

## 2012-11-01 NOTE — Telephone Encounter (Signed)
Please advise      KP 

## 2012-11-01 NOTE — Telephone Encounter (Signed)
Pt called because her leg is still hurting from the last time she saw dr Laury Axon. Pt wanted to know is there someone dr Laury Axon can refer her to or is there something else she could be doing to stop the pain. thanks

## 2012-11-01 NOTE — Telephone Encounter (Signed)
Is the swelling any better?  If no--vascular referral Has she tried taking ultram for pain?

## 2012-11-01 NOTE — Telephone Encounter (Signed)
Spoke with patient and the pain has not improved, and she is still having the swelling as well. Ref put in      [place]

## 2012-11-04 ENCOUNTER — Other Ambulatory Visit: Payer: Self-pay | Admitting: *Deleted

## 2012-11-04 DIAGNOSIS — M79609 Pain in unspecified limb: Secondary | ICD-10-CM

## 2012-11-04 DIAGNOSIS — M7989 Other specified soft tissue disorders: Secondary | ICD-10-CM

## 2012-11-07 ENCOUNTER — Other Ambulatory Visit: Payer: Self-pay | Admitting: Internal Medicine

## 2012-12-20 ENCOUNTER — Encounter: Payer: Self-pay | Admitting: Vascular Surgery

## 2012-12-21 ENCOUNTER — Encounter: Payer: Self-pay | Admitting: Vascular Surgery

## 2012-12-21 ENCOUNTER — Ambulatory Visit (INDEPENDENT_AMBULATORY_CARE_PROVIDER_SITE_OTHER): Payer: 59 | Admitting: Vascular Surgery

## 2012-12-21 ENCOUNTER — Encounter (INDEPENDENT_AMBULATORY_CARE_PROVIDER_SITE_OTHER): Payer: 59 | Admitting: *Deleted

## 2012-12-21 VITALS — BP 136/86 | HR 70 | Resp 16 | Ht 65.5 in | Wt 234.0 lb

## 2012-12-21 DIAGNOSIS — M79609 Pain in unspecified limb: Secondary | ICD-10-CM

## 2012-12-21 DIAGNOSIS — M7989 Other specified soft tissue disorders: Secondary | ICD-10-CM
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
## 2012-12-27 LAB — HM MAMMOGRAPHY

## 2013-01-21 ENCOUNTER — Other Ambulatory Visit: Payer: Self-pay | Admitting: Internal Medicine

## 2013-02-08 ENCOUNTER — Other Ambulatory Visit: Payer: Self-pay | Admitting: *Deleted

## 2013-02-08 MED ORDER — AMLODIPINE BESYLATE 5 MG PO TABS: ORAL_TABLET | ORAL | Status: DC

## 2013-02-08 NOTE — Telephone Encounter (Signed)
Rx refilled for Amlodipine 5 mg. AG cma

## 2013-03-16 ENCOUNTER — Other Ambulatory Visit: Payer: Self-pay | Admitting: Internal Medicine

## 2013-03-17 NOTE — Telephone Encounter (Signed)
OK X1 

## 2013-03-17 NOTE — Telephone Encounter (Signed)
Rx printed and faxed to the pharmacy.//AB/CMA 

## 2013-03-17 NOTE — Telephone Encounter (Signed)
Last seen 06/09/12 and filled 10/12/12 #30. NO UDS or Contract on file. Please advise     KP

## 2013-03-28 ENCOUNTER — Ambulatory Visit (INDEPENDENT_AMBULATORY_CARE_PROVIDER_SITE_OTHER): Payer: 59

## 2013-03-28 DIAGNOSIS — Z23 Encounter for immunization: Secondary | ICD-10-CM

## 2013-04-10 ENCOUNTER — Ambulatory Visit (INDEPENDENT_AMBULATORY_CARE_PROVIDER_SITE_OTHER): Payer: 59 | Admitting: Internal Medicine

## 2013-04-10 ENCOUNTER — Encounter: Payer: Self-pay | Admitting: Internal Medicine

## 2013-04-10 VITALS — BP 152/84 | HR 89 | Temp 99.4°F | Wt 229.0 lb

## 2013-04-10 DIAGNOSIS — J029 Acute pharyngitis, unspecified: Secondary | ICD-10-CM

## 2013-04-10 DIAGNOSIS — R05 Cough: Secondary | ICD-10-CM

## 2013-04-10 DIAGNOSIS — J209 Acute bronchitis, unspecified: Secondary | ICD-10-CM

## 2013-04-10 DIAGNOSIS — R059 Cough, unspecified: Secondary | ICD-10-CM

## 2013-04-10 MED ORDER — HYDROCODONE-HOMATROPINE 5-1.5 MG/5ML PO SYRP: 5.0000 mL | ORAL_SOLUTION | Freq: Four times a day (QID) | ORAL | Status: DC | PRN

## 2013-04-10 MED ORDER — AZITHROMYCIN 250 MG PO TABS: ORAL_TABLET | ORAL | Status: DC

## 2013-04-10 NOTE — Patient Instructions (Addendum)
Zicam Melts or Zinc lozenges as per package label for scratchy throat . Complementary options include  vitamin C 2000 mg daily; & Echinacea for 4-7 days. Report persistent or progressive fever;increased  discolored nasal or chest secretions; or frontal headache or facial  pain.

## 2013-04-10 NOTE — Progress Notes (Signed)
  Subjective:    Patient ID: Dorothy Ritter, female    DOB: 06-05-1946, 67 y.o.   MRN: [id number]  HPI  Symptoms began 04/07/13 as a sore throat which has remained essentially stable. She's also had a cough which has been productive of scant yellow sputum at times but is mainly dry.  She's had some chills and has felt warm but did not measure her temperature  She also has noted some shortness of breath and wheezing.  Mucinex has helped the sore throat some  She has never smoked and has no history of asthma. She is on ACE-I.    Review of Systems She had no significant extrinsic symptoms of itchy, watery eyes, sneezing.  She also denies rhinosinusitis symptoms of frontal headache, facial pain, nasal purulence, dental pain, otic pain, or otic discharge.       Objective:   Physical Exam Gen.:  well-nourished in appearance. Alert, appropriate and cooperative throughout exam. Eyes: No corneal or conjunctival inflammation noted.  Ears: External  ear exam reveals no significant lesions or deformities. Canals clear .TMs normal. Hearing is grossly normal bilaterally. Nose: External nasal exam reveals no deformity or inflammation. Nasal mucosa are pink and moist. No lesions or exudates noted.   Mouth: Oral mucosa and oropharynx reveal no lesions or exudates.Mild erythema. Teeth in good repair. Hoarse Neck: No deformities, masses, or tenderness noted.  Lungs: Normal respiratory effort; chest expands symmetrically. Lungs are clear to auscultation without rales, wheezes, or increased work of breathing. Brassy cough Heart: Normal rate and rhythm. Normal S1 and S2. No gallop, click, or rub. No murmur.                  Musculoskeletal/extremities:  Accentuated curvature of upper thoracic  Spine. No clubbing, cyanosis, edema, or significant extremity  deformity noted. Joints normal  Neurologic: Alert and oriented x3.   Skin: Intact without suspicious lesions or rashes. Lymph: No cervical,  axillary lymphadenopathy present. Psych: Mood and affect are normal. Normally interactive                                                                                        Assessment & Plan:  #1 pharyngitis  #2 cough, mainly dry in the context of ACE inhibitor therapy  Plan: see orders

## 2013-04-12 ENCOUNTER — Telehealth: Payer: Self-pay | Admitting: *Deleted

## 2013-04-12 ENCOUNTER — Other Ambulatory Visit: Payer: Self-pay | Admitting: *Deleted

## 2013-04-12 MED ORDER — CEFUROXIME AXETIL 250 MG PO TABS: 500.0000 mg | ORAL_TABLET | Freq: Two times a day (BID) | ORAL | Status: DC

## 2013-04-12 NOTE — Telephone Encounter (Signed)
Med filled and patient called

## 2013-04-12 NOTE — Telephone Encounter (Signed)
Received call from patient stating that she is not feeling better since Monday's visit. She is stating that the sputum she is now coughing up is dark in color. Please advise.

## 2013-04-12 NOTE — Telephone Encounter (Signed)
Generic Ceftin 500 mg bid #14 

## 2013-05-02 ENCOUNTER — Telehealth: Payer: Self-pay | Admitting: *Deleted

## 2013-05-02 NOTE — Telephone Encounter (Signed)
Patient called and stated that she would like an increase in her dose of Tramadol. She is currently on 50 mg q8h. She states that she is still in pain. Please advise.

## 2013-05-02 NOTE — Telephone Encounter (Signed)
I reviewed the chart; there's been no recent visit concerning increasing pain. She should be seen to see if additional lab studies or imaging are indicated.

## 2013-05-05 ENCOUNTER — Other Ambulatory Visit: Payer: Self-pay | Admitting: Family Medicine

## 2013-05-19 ENCOUNTER — Encounter: Payer: Self-pay | Admitting: Cardiology

## 2013-07-09 ENCOUNTER — Other Ambulatory Visit: Payer: Self-pay | Admitting: Internal Medicine

## 2013-07-10 NOTE — Telephone Encounter (Signed)
Quinapril refilled per protocol. JG//CMA

## 2013-09-13 ENCOUNTER — Telehealth: Payer: Self-pay | Admitting: *Deleted

## 2013-09-13 MED ORDER — TRAMADOL HCL 50 MG PO TABS: ORAL_TABLET | ORAL | Status: DC

## 2013-09-13 NOTE — Telephone Encounter (Signed)
Patient phoned requesting refill for tramadol.  Last OV with PCP 04/10/13 and med last ordered 03/16/13.  Please advise.

## 2013-09-13 NOTE — Telephone Encounter (Signed)
OK X1 

## 2013-09-13 NOTE — Telephone Encounter (Signed)
Script printed & awaiting MD signature for faxing to pharmacy.

## 2013-09-25 ENCOUNTER — Other Ambulatory Visit: Payer: Self-pay | Admitting: Internal Medicine

## 2013-09-25 ENCOUNTER — Other Ambulatory Visit: Payer: Self-pay | Admitting: Family Medicine

## 2013-10-16 ENCOUNTER — Other Ambulatory Visit: Payer: Self-pay | Admitting: Internal Medicine

## 2013-10-16 ENCOUNTER — Other Ambulatory Visit: Payer: Self-pay | Admitting: Family Medicine

## 2013-10-16 DIAGNOSIS — I10 Essential (primary) hypertension: Secondary | ICD-10-CM

## 2013-10-16 DIAGNOSIS — E785 Hyperlipidemia, unspecified: Secondary | ICD-10-CM

## 2013-10-16 DIAGNOSIS — D518 Other vitamin B12 deficiency anemias: Secondary | ICD-10-CM

## 2013-10-16 DIAGNOSIS — E039 Hypothyroidism, unspecified: Secondary | ICD-10-CM

## 2013-10-16 NOTE — Telephone Encounter (Signed)
#  30 1 qhs Lipids, LFTs,CK,BMET , TSH , CBC & dif Last labs 4/14

## 2013-11-30 ENCOUNTER — Ambulatory Visit (INDEPENDENT_AMBULATORY_CARE_PROVIDER_SITE_OTHER): Payer: 59 | Admitting: Internal Medicine

## 2013-11-30 ENCOUNTER — Encounter: Payer: Self-pay | Admitting: Internal Medicine

## 2013-11-30 VITALS — BP 138/94 | HR 95 | Temp 99.0°F | Wt 229.0 lb

## 2013-11-30 DIAGNOSIS — J209 Acute bronchitis, unspecified: Secondary | ICD-10-CM

## 2013-11-30 DIAGNOSIS — R4689 Other symptoms and signs involving appearance and behavior: Secondary | ICD-10-CM | POA: Insufficient documentation

## 2013-11-30 MED ORDER — AMOXICILLIN 500 MG PO CAPS: 500.0000 mg | ORAL_CAPSULE | Freq: Three times a day (TID) | ORAL | Status: DC

## 2013-11-30 MED ORDER — HYDROCODONE-HOMATROPINE 5-1.5 MG/5ML PO SYRP: 5.0000 mL | ORAL_SOLUTION | Freq: Four times a day (QID) | ORAL | Status: DC | PRN

## 2013-11-30 NOTE — Progress Notes (Signed)
   Subjective:    Patient ID: Dorothy Ritter, female    DOB: 14-Jul-1945, 68 y.o.   MRN: [id number]  HPI  Her symptoms began 11/29/13 as a scratchy sore throat associated with head congestion as well as chest congestion. She also experienced some sneezing. Last night she felt feverish but did not take her temperature. She also had chills and sweats.  She now has paroxysms of cough, worse last night, associated with some yellow sputum.  The trigger may of been exposure to clients or work associates who were ill  Mucinex and Robitussin taken with minimal benefit  At this time she continues to have frontal headache, facial pain, sore throat, yellow sputum production, and feels as if she has possible low-grade fever.  She has had associated wheezing and shortness of breath.  She's never smoked. Flu shot is up-to-date.    Review of Systems  She denies pain in the teeth, nasal purulence, earache, or otic discharge.        Objective:   Physical Exam General appearance:good health ;well nourished; no acute distress or increased work of breathing is present.  No  lymphadenopathy about the head, neck, or axilla noted.   Eyes: No conjunctival inflammation or lid edema is present. There is no scleral icterus.  Ears:  External ear exam shows no significant lesions or deformities.  Otoscopic examination reveals clear canals, tympanic membranes are intact bilaterally without bulging, retraction, inflammation or discharge. Left TM dull  Nose:  External nasal examination shows no deformity or inflammation. Nasal mucosa are dry and erythematous  without lesions or exudates. No septal dislocation or deviation.No obstruction to airflow.  Oral exam: Dental hygiene is good; lips and gums are healthy appearing.There is no oropharyngeal erythema or exudate noted. Slightly hoarse   Neck:  No deformities, thyromegaly, masses, or tenderness noted.   Supple with full range of motion without pain.    Heart:  Normal rate and regular rhythm. S1 and S2 normal without gallop, murmur, click, rub or other extra sounds.   Lungs:Chest clear to auscultation; no wheezes, rhonchi,rales ,or rubs present.No increased work of breathing.    Extremities:  No cyanosis, edema, or clubbing  noted    Skin: Warm & dry w/o jaundice or tenting.         Assessment & Plan:  #1 acute bronchitis w/o bronchospasm #2 URI, acute Plan: See orders and recommendations

## 2013-11-30 NOTE — Patient Instructions (Signed)
Plain Mucinex (NOT D) for thick secretions ;force NON dairy fluids .   Nasal cleansing in the shower as discussed with lather of mild shampoo.After 10 seconds wash off lather while  exhaling through nostrils. Make sure that all residual soap is removed to prevent irritation.  Flonase OR Nasacort AQ 1 spray in each nostril twice a day as needed. Use the "crossover" technique into opposite nostril spraying toward opposite ear @ 45 degree angle, not straight up into nostril.  Use a Neti pot daily only  as needed for significant sinus congestion; going from open side to congested side . Plain Allegra (NOT D )  160 daily , Loratidine 10 mg , OR Zyrtec 10 mg @ bedtime  as needed for itchy eyes & sneezing. Zicam Melts or Zinc lozenges as per package label for scratchy throat . Complementary options include  vitamin C 2000 mg daily; & Echinacea for 4-7 days. Report persistent or progressive fever; discolored nasal or chest secretions; or frontal headache or facial  pain.   Fill the  prescription for antibiotic it there is not dramatic improvement in the next 72 hours. 

## 2013-11-30 NOTE — Progress Notes (Signed)
Pre visit review using our clinic review tool, if applicable. No additional management support is needed unless otherwise documented below in the visit note. 

## 2014-02-02 ENCOUNTER — Other Ambulatory Visit: Payer: Self-pay | Admitting: Internal Medicine

## 2014-04-16 ENCOUNTER — Other Ambulatory Visit: Payer: Self-pay | Admitting: Internal Medicine

## 2014-05-15 ENCOUNTER — Other Ambulatory Visit: Payer: Self-pay

## 2014-05-16 MED ORDER — TRAMADOL HCL 50 MG PO TABS: ORAL_TABLET | ORAL | Status: DC

## 2014-05-16 NOTE — Telephone Encounter (Signed)
Tramadol has been called to [facility] on [address]

## 2014-05-16 NOTE — Telephone Encounter (Signed)
OK X1 

## 2014-07-03 ENCOUNTER — Other Ambulatory Visit: Payer: Self-pay

## 2014-07-03 MED ORDER — POTASSIUM CHLORIDE ER 8 MEQ PO TBCR: 8.0000 meq | EXTENDED_RELEASE_TABLET | Freq: Every day | ORAL | Status: DC

## 2014-09-10 ENCOUNTER — Other Ambulatory Visit: Payer: Self-pay | Admitting: Internal Medicine

## 2014-09-17 ENCOUNTER — Other Ambulatory Visit: Payer: Self-pay

## 2014-09-17 MED ORDER — LEVOTHYROXINE SODIUM 75 MCG PO TABS: ORAL_TABLET | ORAL | Status: DC

## 2014-11-19 ENCOUNTER — Other Ambulatory Visit: Payer: Self-pay | Admitting: *Deleted

## 2014-11-19 MED ORDER — QUINAPRIL HCL 40 MG PO TABS: 60.0000 mg | ORAL_TABLET | Freq: Every day | ORAL | Status: DC

## 2015-01-14 ENCOUNTER — Other Ambulatory Visit: Payer: Self-pay | Admitting: Emergency Medicine

## 2015-01-14 MED ORDER — POTASSIUM CHLORIDE ER 8 MEQ PO TBCR: 8.0000 meq | EXTENDED_RELEASE_TABLET | Freq: Every day | ORAL | Status: DC

## 2015-01-14 MED ORDER — LEVOTHYROXINE SODIUM 75 MCG PO TABS: ORAL_TABLET | ORAL | Status: DC

## 2015-01-14 NOTE — Telephone Encounter (Signed)
LVM stating refills have been sent in to pharm and that an OV  And labs are needed before any further refills

## 2015-01-25 ENCOUNTER — Other Ambulatory Visit: Payer: Self-pay | Admitting: Internal Medicine

## 2015-01-25 ENCOUNTER — Encounter: Payer: Self-pay | Admitting: Internal Medicine

## 2015-01-25 ENCOUNTER — Other Ambulatory Visit (INDEPENDENT_AMBULATORY_CARE_PROVIDER_SITE_OTHER): Payer: 59

## 2015-01-25 ENCOUNTER — Ambulatory Visit (INDEPENDENT_AMBULATORY_CARE_PROVIDER_SITE_OTHER): Payer: 59 | Admitting: Internal Medicine

## 2015-01-25 VITALS — BP 140/90 | HR 65 | Temp 97.8°F | Resp 16 | Wt 232.0 lb

## 2015-01-25 DIAGNOSIS — E039 Hypothyroidism, unspecified: Secondary | ICD-10-CM

## 2015-01-25 DIAGNOSIS — R739 Hyperglycemia, unspecified: Secondary | ICD-10-CM

## 2015-01-25 DIAGNOSIS — E785 Hyperlipidemia, unspecified: Secondary | ICD-10-CM

## 2015-01-25 DIAGNOSIS — I10 Essential (primary) hypertension: Secondary | ICD-10-CM

## 2015-01-25 DIAGNOSIS — D518 Other vitamin B12 deficiency anemias: Secondary | ICD-10-CM | POA: Diagnosis not present

## 2015-01-25 DIAGNOSIS — Z0000 Encounter for general adult medical examination without abnormal findings: Secondary | ICD-10-CM

## 2015-01-25 LAB — CBC WITH DIFFERENTIAL/PLATELET
Basophils Absolute: 0.1 10*3/uL (ref 0.0–0.1)
Basophils Relative: 1.3 % (ref 0.0–3.0)
EOS PCT: 3.8 % (ref 0.0–5.0)
Eosinophils Absolute: 0.2 10*3/uL (ref 0.0–0.7)
HCT: 38.3 % (ref 36.0–46.0)
HEMOGLOBIN: 13 g/dL (ref 12.0–15.0)
LYMPHS ABS: 2.5 10*3/uL (ref 0.7–4.0)
LYMPHS PCT: 39.6 % (ref 12.0–46.0)
MCHC: 34 g/dL (ref 30.0–36.0)
MCV: 104.6 fl — ABNORMAL HIGH (ref 78.0–100.0)
Monocytes Absolute: 0.4 10*3/uL (ref 0.1–1.0)
Monocytes Relative: 7.1 % (ref 3.0–12.0)
NEUTROS PCT: 48.2 % (ref 43.0–77.0)
Neutro Abs: 3.1 10*3/uL (ref 1.4–7.7)
Platelets: 359 10*3/uL (ref 150.0–400.0)
RBC: 3.66 Mil/uL — ABNORMAL LOW (ref 3.87–5.11)
RDW: 15.9 % — ABNORMAL HIGH (ref 11.5–15.5)
WBC: 6.3 10*3/uL (ref 4.0–10.5)

## 2015-01-25 LAB — CK: CK TOTAL: 219 U/L — AB (ref 7–177)

## 2015-01-25 LAB — VITAMIN B12: Vitamin B-12: 94 pg/mL — ABNORMAL LOW (ref 211–911)

## 2015-01-25 LAB — HEPATIC FUNCTION PANEL
ALBUMIN: 3.8 g/dL (ref 3.5–5.2)
ALT: 16 U/L (ref 0–35)
AST: 16 U/L (ref 0–37)
Alkaline Phosphatase: 79 U/L (ref 39–117)
BILIRUBIN DIRECT: 0.1 mg/dL (ref 0.0–0.3)
BILIRUBIN TOTAL: 0.4 mg/dL (ref 0.2–1.2)
Total Protein: 6.9 g/dL (ref 6.0–8.3)

## 2015-01-25 LAB — BASIC METABOLIC PANEL
BUN: 12 mg/dL (ref 6–23)
CALCIUM: 8.7 mg/dL (ref 8.4–10.5)
CHLORIDE: 104 meq/L (ref 96–112)
CO2: 29 mEq/L (ref 19–32)
Creatinine, Ser: 0.63 mg/dL (ref 0.40–1.20)
GFR: 120.11 mL/min (ref >60.00)
Glucose, Bld: 96 mg/dL (ref 70–99)
Potassium: 3.9 mEq/L (ref 3.5–5.1)
Sodium: 139 mEq/L (ref 135–145)

## 2015-01-25 LAB — TSH: TSH: 6.33 u[IU]/mL — AB (ref 0.35–4.50)

## 2015-01-25 LAB — HEMOGLOBIN A1C: HEMOGLOBIN A1C: 5.9 % (ref 4.6–6.5)

## 2015-01-25 NOTE — Patient Instructions (Signed)
Minimal Blood Pressure Goal= AVERAGE < 140/90;  Ideal is an AVERAGE < 135/85. This AVERAGE should be calculated from @ least 5-7 BP readings taken @ different times of day on different days of week. You should not respond to isolated BP readings , but rather the AVERAGE for that week .Please bring your  blood pressure cuff to office visits to verify that it is reliable.It  can also be checked against the blood pressure device at the pharmacy. Finger or wrist cuffs are not dependable; an arm cuff is. Your next office appointment will be determined based upon review of your pending labs. Those written interpretation of the lab results and instructions will be transmitted to you by mail for your records.  Critical results will be called.   Followup as needed for any active or acute issue. Please report any significant change in your symptoms. 

## 2015-01-25 NOTE — Assessment & Plan Note (Signed)
Lipoprofile, LFTs, TSH ,CK 

## 2015-01-25 NOTE — Assessment & Plan Note (Signed)
TSH 
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
## 2015-01-25 NOTE — Assessment & Plan Note (Signed)
CBC & dif;B12 level 

## 2015-01-25 NOTE — Assessment & Plan Note (Signed)
Blood pressure goals reviewed. BMET 

## 2015-01-25 NOTE — Assessment & Plan Note (Signed)
A1c

## 2015-01-25 NOTE — Progress Notes (Signed)
Pre visit review using our clinic review tool, if applicable. No additional management support is needed unless otherwise documented below in the visit note. 

## 2015-01-31 LAB — NMR LIPOPROFILE WITH LIPIDS
Cholesterol, Total: 214 mg/dL — ABNORMAL HIGH (ref 100–199)
HDL Particle Number: 28.8 umol/L — ABNORMAL LOW (ref >=30.5)
HDL SIZE: 9.3 nm (ref >=9.2)
HDL-C: 64 mg/dL (ref >39)
LARGE HDL: 9.5 umol/L (ref >=4.8)
LDL (calc): 134 mg/dL — ABNORMAL HIGH (ref 0–99)
LDL Particle Number: 1652 nmol/L — ABNORMAL HIGH (ref <1000)
LDL Size: 21.6 nm (ref >=20.8)
Large VLDL-P: 1.1 nmol/L (ref <=2.7)
Small LDL Particle Number: 417 nmol/L (ref <=527)
Triglycerides: 78 mg/dL (ref 0–149)
VLDL Size: 37.8 nm (ref <=46.6)

## 2015-03-14 ENCOUNTER — Telehealth: Payer: Self-pay | Admitting: *Deleted

## 2015-03-14 DIAGNOSIS — R609 Edema, unspecified: Secondary | ICD-10-CM

## 2015-03-14 MED ORDER — ASPIRIN 325 MG PO TABS: 325.0000 mg | ORAL_TABLET | Freq: Every day | ORAL | Status: DC

## 2015-03-14 MED ORDER — FUROSEMIDE 20 MG PO TABS: 20.0000 mg | ORAL_TABLET | Freq: Two times a day (BID) | ORAL | Status: DC

## 2015-03-14 MED ORDER — TRAMADOL HCL 50 MG PO TABS: ORAL_TABLET | ORAL | Status: DC

## 2015-03-14 MED ORDER — POTASSIUM CHLORIDE ER 8 MEQ PO TBCR: 8.0000 meq | EXTENDED_RELEASE_TABLET | Freq: Every day | ORAL | Status: DC

## 2015-03-14 MED ORDER — QUINAPRIL HCL 40 MG PO TABS: 60.0000 mg | ORAL_TABLET | Freq: Every day | ORAL | Status: DC

## 2015-03-14 MED ORDER — PRAVASTATIN SODIUM 40 MG PO TABS: 40.0000 mg | ORAL_TABLET | Freq: Every day | ORAL | Status: DC

## 2015-03-14 MED ORDER — LEVOTHYROXINE SODIUM 75 MCG PO TABS: ORAL_TABLET | ORAL | Status: DC

## 2015-03-14 MED ORDER — AMLODIPINE BESYLATE 5 MG PO TABS: ORAL_TABLET | ORAL | Status: DC

## 2015-03-14 NOTE — Telephone Encounter (Signed)
#  30 , R X1  My retirement date is 07/06/2015; but I will be in office on a limited schedule Oct-Dec. To guarantee continuity of care you should transition your care to another PCP by Oct 1,2016.

## 2015-03-14 NOTE — Telephone Encounter (Signed)
Receive call pt states she saw md for CPX back in July needing to get refills on all meds. Verified pharmacy inform pt will go ahead and send all maintenance meds, but will have to get approval by md on the tramadol. Dr. Alwyn Ren is it ok to refill pt tramadol...[name]

## 2015-03-14 NOTE — Telephone Encounter (Signed)
Notified pt md ok tramadol has been call to [facility] spoke with Nick/pharmacist.../lmb

## 2015-11-14 ENCOUNTER — Ambulatory Visit: Payer: Medicare Other | Admitting: Family

## 2015-12-23 ENCOUNTER — Encounter: Payer: Self-pay | Admitting: Family

## 2015-12-23 ENCOUNTER — Ambulatory Visit (INDEPENDENT_AMBULATORY_CARE_PROVIDER_SITE_OTHER): Payer: Medicare Other | Admitting: Family

## 2015-12-23 VITALS — BP 150/92 | HR 73 | Temp 97.8°F | Resp 16 | Ht 65.5 in | Wt 223.0 lb

## 2015-12-23 DIAGNOSIS — E785 Hyperlipidemia, unspecified: Secondary | ICD-10-CM | POA: Diagnosis not present

## 2015-12-23 DIAGNOSIS — Z1211 Encounter for screening for malignant neoplasm of colon: Secondary | ICD-10-CM

## 2015-12-23 DIAGNOSIS — M5416 Radiculopathy, lumbar region: Secondary | ICD-10-CM

## 2015-12-23 DIAGNOSIS — R609 Edema, unspecified: Secondary | ICD-10-CM

## 2015-12-23 DIAGNOSIS — E039 Hypothyroidism, unspecified: Secondary | ICD-10-CM

## 2015-12-23 DIAGNOSIS — Z23 Encounter for immunization: Secondary | ICD-10-CM

## 2015-12-23 DIAGNOSIS — E669 Obesity, unspecified: Secondary | ICD-10-CM

## 2015-12-23 DIAGNOSIS — I10 Essential (primary) hypertension: Secondary | ICD-10-CM | POA: Diagnosis not present

## 2015-12-23 MED ORDER — TRAMADOL HCL 50 MG PO TABS: ORAL_TABLET | ORAL | Status: DC

## 2015-12-23 MED ORDER — POTASSIUM CHLORIDE ER 8 MEQ PO TBCR: 8.0000 meq | EXTENDED_RELEASE_TABLET | Freq: Every day | ORAL | Status: DC

## 2015-12-23 MED ORDER — PRAVASTATIN SODIUM 40 MG PO TABS: 40.0000 mg | ORAL_TABLET | Freq: Every day | ORAL | Status: DC

## 2015-12-23 MED ORDER — FUROSEMIDE 20 MG PO TABS: 20.0000 mg | ORAL_TABLET | Freq: Two times a day (BID) | ORAL | Status: DC
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
## 2015-12-23 NOTE — Assessment & Plan Note (Signed)
Hypertension appears labile and greater than goal 140/90 with current regimen secondary to being out of furosemide. No symptoms of end organ damage or worse headache of life. Continue current dosage of furosemide, amlodipine, and quinapril. Continue to monitor blood pressure at home. Discussed importance of decreasing sodium in her nutritional intake and increasing physical activity. Follow-up in 3 months or sooner.

## 2015-12-23 NOTE — Assessment & Plan Note (Signed)
Hypothyroidism appears uncontrolled with previous TSH of 6.33. No adverse side effects. Obtain TSH. Continue current dosage of levothyroxine pending TSH results.

## 2015-12-23 NOTE — Patient Instructions (Signed)
Thank you for choosing [facility].  Summary/Instructions:   Please continue to take your medication as prescribed.   Continue to monitor blood pressure at home.  Decrease sodium in diet.   Your prescription(s) have been submitted to your pharmacy or been printed and provided for you. Please take as directed and contact our office if you believe you are having problem(s) with the medication(s) or have any questions.  Please stop by the lab on the lower level of the building for your blood work when fasting.  Your results will be released to MyChart (or called to you) after review, usually within 72 hours after test completion. If any changes need to be made, you will be notified at that same time.

## 2015-12-23 NOTE — Progress Notes (Signed)
Pre visit review using our clinic review tool, if applicable. No additional management support is needed unless otherwise documented below in the visit note. 

## 2015-12-23 NOTE — Assessment & Plan Note (Signed)
Hyperlipidemia currently managed on pravastatin and appears stable. Obtain lipid profile. No adverse side effects or myalgias. Continue current dosage of pravastatin with follow-up pending lipid profile.

## 2015-12-23 NOTE — Assessment & Plan Note (Signed)
BMI of 36. Encouraged weight loss of 5-10% of current body weight through nutrition and physical activity.Recommend increasing physical activity to 30 minutes of moderate level activity daily. Encourage nutritional intake that focuses on nutrient dense foods and is moderate, varied, and balanced and is low in saturated fats and processed/sugary foods. Continue to monitor.

## 2016-01-21 ENCOUNTER — Encounter: Payer: Self-pay | Admitting: Internal Medicine

## 2016-02-14 ENCOUNTER — Other Ambulatory Visit: Payer: Self-pay | Admitting: Family

## 2016-02-14 NOTE — Telephone Encounter (Signed)
Last refill was 12/23/15

## 2016-04-05 ENCOUNTER — Other Ambulatory Visit: Payer: Self-pay | Admitting: Internal Medicine

## 2016-04-14 ENCOUNTER — Other Ambulatory Visit: Payer: Self-pay | Admitting: Internal Medicine

## 2016-04-14 NOTE — Telephone Encounter (Signed)
Routing to greg

## 2016-04-16 ENCOUNTER — Other Ambulatory Visit: Payer: Self-pay | Admitting: Family

## 2016-04-24 ENCOUNTER — Encounter: Payer: Medicare Other | Admitting: Internal Medicine

## 2016-06-12 DIAGNOSIS — Z23 Encounter for immunization: Secondary | ICD-10-CM | POA: Diagnosis not present

## 2016-12-21 ENCOUNTER — Ambulatory Visit (INDEPENDENT_AMBULATORY_CARE_PROVIDER_SITE_OTHER): Payer: Medicare Other | Admitting: Family

## 2016-12-21 ENCOUNTER — Encounter: Payer: Self-pay | Admitting: Family

## 2016-12-21 ENCOUNTER — Other Ambulatory Visit: Payer: Medicare Other

## 2016-12-21 VITALS — BP 154/88 | HR 76 | Temp 98.2°F | Resp 16 | Ht 65.5 in | Wt 217.4 lb

## 2016-12-21 DIAGNOSIS — E039 Hypothyroidism, unspecified: Secondary | ICD-10-CM | POA: Diagnosis not present

## 2016-12-21 DIAGNOSIS — I10 Essential (primary) hypertension: Secondary | ICD-10-CM

## 2016-12-21 DIAGNOSIS — M5416 Radiculopathy, lumbar region: Secondary | ICD-10-CM | POA: Diagnosis not present

## 2016-12-21 MED ORDER — TRAMADOL HCL 50 MG PO TABS: ORAL_TABLET | ORAL | 1 refills | Status: DC

## 2016-12-21 NOTE — Assessment & Plan Note (Addendum)
Blood pressure remains poorly controlled likely related to diet that she consumes a significant amount of salt. Continue current dosage of amlodipine and quinapril. Obtain CBC and comprehensive metabolic panel for therapeutic drug monitoring. Encouraged to monitor blood pressure at home and follow low-sodium diet. Denies worse headache of life with no new symptoms of end organ damage noted on physical exam. Continue to monitor.

## 2016-12-21 NOTE — Patient Instructions (Addendum)
Thank you for choosing [facility].  SUMMARY AND INSTRUCTIONS:  Please continue to take your medications as prescribed.  Monitor your blood pressure at home.  Decrease sodium in your diet.   Continue with physical activity.   Medication:  Your prescription(s) have been submitted to your pharmacy or been printed and provided for you. Please take as directed and contact our office if you believe you are having problem(s) with the medication(s) or have any questions.  Labs:  Please stop by the lab on the lower level of the building for your blood work. Your results will be released to MyChart (or called to you) after review, usually within 72 hours after test completion. If any changes need to be made, you will be notified at that same time.  1.) The lab is open from 7:30am to 5:30 pm Monday-Friday 2.) No appointment is necessary 3.) Fasting (if needed) is 6-8 hours after food and drink; black coffee and water are okay   Follow up:  If your symptoms worsen or fail to improve, please contact our office for further instruction, or in case of emergency go directly to the emergency room at the closest medical facility.    DASH Eating Plan DASH stands for "Dietary Approaches to Stop Hypertension." The DASH eating plan is a healthy eating plan that has been shown to reduce high blood pressure (hypertension). It may also reduce your risk for type 2 diabetes, heart disease, and stroke. The DASH eating plan may also help with weight loss. What are tips for following this plan? General guidelines  Avoid eating more than 2,300 mg (milligrams) of salt (sodium) a day. If you have hypertension, you may need to reduce your sodium intake to 1,500 mg a day.  Limit alcohol intake to no more than 1 drink a day for nonpregnant women and 2 drinks a day for men. One drink equals 12 oz of beer, 5 oz of wine, or 1 oz of hard liquor.  Work with your health care provider to maintain a healthy body  weight or to lose weight. Ask what an ideal weight is for you.  Get at least 30 minutes of exercise that causes your heart to beat faster (aerobic exercise) most days of the week. Activities may include walking, swimming, or biking.  Work with your health care provider or diet and nutrition specialist (dietitian) to adjust your eating plan to your individual calorie needs. Reading food labels  Check food labels for the amount of sodium per serving. Choose foods with less than 5 percent of the Daily Value of sodium. Generally, foods with less than 300 mg of sodium per serving fit into this eating plan.  To find whole grains, look for the word "whole" as the first word in the ingredient list. Shopping  Buy products labeled as "low-sodium" or "no salt added."  Buy fresh foods. Avoid canned foods and premade or frozen meals. Cooking  Avoid adding salt when cooking. Use salt-free seasonings or herbs instead of table salt or sea salt. Check with your health care provider or pharmacist before using salt substitutes.  Do not fry foods. Cook foods using healthy methods such as baking, boiling, grilling, and broiling instead.  Cook with heart-healthy oils, such as olive, canola, soybean, or sunflower oil. Meal planning   Eat a balanced diet that includes: ? 5 or more servings of fruits and vegetables each day. At each meal, try to fill half of your plate with fruits and vegetables. ? Up to  6-8 servings of whole grains each day. ? Less than 6 oz of lean meat, poultry, or fish each day. A 3-oz serving of meat is about the same size as a deck of cards. One egg equals 1 oz. ? 2 servings of low-fat dairy each day. ? A serving of nuts, seeds, or beans 5 times each week. ? Heart-healthy fats. Healthy fats called Omega-3 fatty acids are found in foods such as flaxseeds and coldwater fish, like sardines, salmon, and mackerel.  Limit how much you eat of the following: ? Canned or prepackaged  foods. ? Food that is high in trans fat, such as fried foods. ? Food that is high in saturated fat, such as fatty meat. ? Sweets, desserts, sugary drinks, and other foods with added sugar. ? Full-fat dairy products.  Do not salt foods before eating.  Try to eat at least 2 vegetarian meals each week.  Eat more home-cooked food and less restaurant, buffet, and fast food.  When eating at a restaurant, ask that your food be prepared with less salt or no salt, if possible. What foods are recommended? The items listed may not be a complete list. Talk with your dietitian about what dietary choices are best for you. Grains Whole-grain or whole-wheat bread. Whole-grain or whole-wheat pasta. Brown rice. Orpah Cobb. Bulgur. Whole-grain and low-sodium cereals. Pita bread. Low-fat, low-sodium crackers. Whole-wheat flour tortillas. Vegetables Fresh or frozen vegetables (raw, steamed, roasted, or grilled). Low-sodium or reduced-sodium tomato and vegetable juice. Low-sodium or reduced-sodium tomato sauce and tomato paste. Low-sodium or reduced-sodium canned vegetables. Fruits All fresh, dried, or frozen fruit. Canned fruit in natural juice (without added sugar). Meat and other protein foods Skinless chicken or [place]. Ground chicken or [place]. Pork with fat trimmed off. Fish and seafood. Egg whites. Dried beans, peas, or lentils. Unsalted nuts, nut butters, and seeds. Unsalted canned beans. Lean cuts of beef with fat trimmed off. Low-sodium, lean deli meat. Dairy Low-fat (1%) or fat-free (skim) milk. Fat-free, low-fat, or reduced-fat cheeses. Nonfat, low-sodium ricotta or cottage cheese. Low-fat or nonfat yogurt. Low-fat, low-sodium cheese. Fats and oils Soft margarine without trans fats. Vegetable oil. Low-fat, reduced-fat, or light mayonnaise and salad dressings (reduced-sodium). Canola, safflower, olive, soybean, and sunflower oils. Avocado. Seasoning and other foods Herbs. Spices. Seasoning  mixes without salt. Unsalted popcorn and pretzels. Fat-free sweets. What foods are not recommended? The items listed may not be a complete list. Talk with your dietitian about what dietary choices are best for you. Grains Baked goods made with fat, such as croissants, muffins, or some breads. Dry pasta or rice meal packs. Vegetables Creamed or fried vegetables. Vegetables in a cheese sauce. Regular canned vegetables (not low-sodium or reduced-sodium). Regular canned tomato sauce and paste (not low-sodium or reduced-sodium). Regular tomato and vegetable juice (not low-sodium or reduced-sodium). Rosita Fire. Olives. Fruits Canned fruit in a light or heavy syrup. Fried fruit. Fruit in cream or butter sauce. Meat and other protein foods Fatty cuts of meat. Ribs. Fried meat. Tomasa Blase. Sausage. Bologna and other processed lunch meats. Salami. Fatback. Hotdogs. Bratwurst. Salted nuts and seeds. Canned beans with added salt. Canned or smoked fish. Whole eggs or egg yolks. Chicken or [place] with skin. Dairy Whole or 2% milk, cream, and half-and-half. Whole or full-fat cream cheese. Whole-fat or sweetened yogurt. Full-fat cheese. Nondairy creamers. Whipped toppings. Processed cheese and cheese spreads. Fats and oils Butter. Stick margarine. Lard. Shortening. Ghee. Bacon fat. Tropical oils, such as coconut, palm kernel, or palm oil. Seasoning and  other foods Salted popcorn and pretzels. Onion salt, garlic salt, seasoned salt, table salt, and sea salt. Worcestershire sauce. Tartar sauce. Barbecue sauce. Teriyaki sauce. Soy sauce, including reduced-sodium. Steak sauce. Canned and packaged gravies. Fish sauce. Oyster sauce. Cocktail sauce. Horseradish that you find on the shelf. Ketchup. Mustard. Meat flavorings and tenderizers. Bouillon cubes. Hot sauce and Tabasco sauce. Premade or packaged marinades. Premade or packaged taco seasonings. Relishes. Regular salad dressings. Where to find more information:  National  Heart, Lung, and Blood Institute: PopSteam.is  American Heart Association: www.heart.org Summary  The DASH eating plan is a healthy eating plan that has been shown to reduce high blood pressure (hypertension). It may also reduce your risk for type 2 diabetes, heart disease, and stroke.  With the DASH eating plan, you should limit salt (sodium) intake to 2,300 mg a day. If you have hypertension, you may need to reduce your sodium intake to 1,500 mg a day.  When on the DASH eating plan, aim to eat more fresh fruits and vegetables, whole grains, lean proteins, low-fat dairy, and heart-healthy fats.  Work with your health care provider or diet and nutrition specialist (dietitian) to adjust your eating plan to your individual calorie needs. This information is not intended to replace advice given to you by your health care provider. Make sure you discuss any questions you have with your health care provider. Document Released: 06/11/2011 Document Revised: 06/15/2016 Document Reviewed: 06/15/2016 Elsevier Interactive Patient Education  2017 [employer].

## 2016-12-21 NOTE — Assessment & Plan Note (Signed)
Stable with no current exacerbation using tramadol as needed for uncontrolled pain by over-the-counter medications. Continue home exercise therapy and ice/heat regimen as needed. Follow-up if symptoms worsen or do not improve.

## 2016-12-21 NOTE — Assessment & Plan Note (Signed)
Stable with current medication regimen and no adverse side effects. Obtain TSH. Continue current dosage of levothyroxine pending TSH results.
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
## 2016-12-22 ENCOUNTER — Other Ambulatory Visit (INDEPENDENT_AMBULATORY_CARE_PROVIDER_SITE_OTHER): Payer: Medicare Other

## 2016-12-22 DIAGNOSIS — I10 Essential (primary) hypertension: Secondary | ICD-10-CM

## 2016-12-22 DIAGNOSIS — E039 Hypothyroidism, unspecified: Secondary | ICD-10-CM

## 2016-12-22 DIAGNOSIS — E785 Hyperlipidemia, unspecified: Secondary | ICD-10-CM

## 2016-12-22 LAB — COMPREHENSIVE METABOLIC PANEL
ALBUMIN: 3.8 g/dL (ref 3.5–5.2)
ALK PHOS: 66 U/L (ref 39–117)
ALT: 12 U/L (ref 0–35)
AST: 14 U/L (ref 0–37)
BUN: 17 mg/dL (ref 6–23)
CHLORIDE: 106 meq/L (ref 96–112)
CO2: 27 mEq/L (ref 19–32)
Calcium: 8.8 mg/dL (ref 8.4–10.5)
Creatinine, Ser: 0.61 mg/dL (ref 0.40–1.20)
GFR: 123.99 mL/min (ref >60.00)
Glucose, Bld: 89 mg/dL (ref 70–99)
POTASSIUM: 4 meq/L (ref 3.5–5.1)
SODIUM: 140 meq/L (ref 135–145)
TOTAL PROTEIN: 6.9 g/dL (ref 6.0–8.3)
Total Bilirubin: 0.6 mg/dL (ref 0.2–1.2)

## 2016-12-22 LAB — LIPID PANEL
CHOLESTEROL: 172 mg/dL (ref 0–200)
HDL: 57.8 mg/dL (ref >39.00)
LDL CALC: 101 mg/dL — AB (ref 0–99)
NonHDL: 114.39
TRIGLYCERIDES: 65 mg/dL (ref 0.0–149.0)
Total CHOL/HDL Ratio: 3
VLDL: 13 mg/dL (ref 0.0–40.0)

## 2016-12-22 LAB — CBC
HEMATOCRIT: 34.6 % — AB (ref 36.0–46.0)
HEMOGLOBIN: 11.9 g/dL — AB (ref 12.0–15.0)
MCHC: 34.5 g/dL (ref 30.0–36.0)
MCV: 129.2 fl — ABNORMAL HIGH (ref 78.0–100.0)
Platelets: 330 10*3/uL (ref 150.0–400.0)
RBC: 2.68 Mil/uL — AB (ref 3.87–5.11)
RDW: 15.1 % (ref 11.5–15.5)
WBC: 6.3 10*3/uL (ref 4.0–10.5)

## 2016-12-22 LAB — TSH: TSH: 2.76 u[IU]/mL (ref 0.35–4.50)

## 2017-01-08 ENCOUNTER — Other Ambulatory Visit: Payer: Self-pay | Admitting: Family

## 2017-01-08 DIAGNOSIS — I10 Essential (primary) hypertension: Secondary | ICD-10-CM

## 2017-01-08 DIAGNOSIS — E785 Hyperlipidemia, unspecified: Secondary | ICD-10-CM

## 2017-01-15 ENCOUNTER — Telehealth: Payer: Self-pay | Admitting: Family

## 2017-01-15 NOTE — Telephone Encounter (Signed)
Pt said that when she was here to see Marya Amsler in June he was going to order a Cologuard test kit to be sent to her home. She said that she still has not received it.

## 2017-01-15 NOTE — Telephone Encounter (Signed)
Orders for cologuard have been placed.

## 2017-03-04 DIAGNOSIS — Z1211 Encounter for screening for malignant neoplasm of colon: Secondary | ICD-10-CM | POA: Diagnosis not present

## 2017-03-04 DIAGNOSIS — Z1212 Encounter for screening for malignant neoplasm of rectum: Secondary | ICD-10-CM | POA: Diagnosis not present

## 2017-03-04 LAB — COLOGUARD: Cologuard: NEGATIVE

## 2017-04-25 ENCOUNTER — Other Ambulatory Visit: Payer: Self-pay | Admitting: Family

## 2017-05-12 ENCOUNTER — Encounter: Payer: Self-pay | Admitting: Family

## 2017-06-03 ENCOUNTER — Other Ambulatory Visit: Payer: Self-pay | Admitting: *Deleted

## 2017-06-03 ENCOUNTER — Telehealth: Payer: Self-pay | Admitting: Family

## 2017-06-03 DIAGNOSIS — R609 Edema, unspecified: Secondary | ICD-10-CM

## 2017-06-03 DIAGNOSIS — E785 Hyperlipidemia, unspecified: Secondary | ICD-10-CM

## 2017-06-03 DIAGNOSIS — I10 Essential (primary) hypertension: Secondary | ICD-10-CM

## 2017-06-03 MED ORDER — AMLODIPINE BESYLATE 5 MG PO TABS: ORAL_TABLET | ORAL | 0 refills | Status: DC

## 2017-06-03 MED ORDER — PRAVASTATIN SODIUM 40 MG PO TABS: 40.0000 mg | ORAL_TABLET | Freq: Every day | ORAL | 0 refills | Status: DC

## 2017-06-03 MED ORDER — LEVOTHYROXINE SODIUM 75 MCG PO TABS: ORAL_TABLET | ORAL | 0 refills | Status: DC

## 2017-06-03 MED ORDER — FUROSEMIDE 20 MG PO TABS: 20.0000 mg | ORAL_TABLET | Freq: Two times a day (BID) | ORAL | 0 refills | Status: DC

## 2017-06-03 NOTE — Telephone Encounter (Signed)
Per office policy sent enough refills to local pharmacy until appt.../lmb  

## 2017-06-03 NOTE — Telephone Encounter (Signed)
Copied from CRM [id number]. Topic: Quick Communication - Rx Refill/Question >> Jun 03, 2017  2:11 PM Leafy RoRobinson, Norma J wrote: Has the patient contacted their pharmacy? yes  Pt has made an appt with shambley on 08-09-2017. Pt was seeing [facility] (with phone number or street name): [facility] [place] [phone]. Pt needs refills on amlodipine,furosemide,levothyroxine and pravastatin   Agent: Please be advised that RX refills may take up to 48 hours. We ask that you follow-up with your pharmacy.

## 2017-06-03 NOTE — Telephone Encounter (Signed)
Last appointment 12/2016- patient has scheduled appointment 08/09/17. Refilled Rx thru that appointment.

## 2017-06-07 ENCOUNTER — Other Ambulatory Visit: Payer: Self-pay

## 2017-06-07 MED ORDER — QUINAPRIL HCL 40 MG PO TABS: 60.0000 mg | ORAL_TABLET | Freq: Every day | ORAL | 1 refills | Status: DC

## 2017-06-09 ENCOUNTER — Ambulatory Visit: Payer: Medicare Other | Admitting: Family

## 2017-06-09 ENCOUNTER — Encounter: Payer: Self-pay | Admitting: Family

## 2017-06-09 VITALS — BP 130/80 | HR 99 | Temp 99.4°F | Ht 65.5 in | Wt 211.0 lb

## 2017-06-09 DIAGNOSIS — J209 Acute bronchitis, unspecified: Secondary | ICD-10-CM | POA: Diagnosis not present

## 2017-06-09 MED ORDER — ALBUTEROL SULFATE (2.5 MG/3ML) 0.083% IN NEBU
2.5000 mg | INHALATION_SOLUTION | Freq: Once | RESPIRATORY_TRACT | Status: AC
  Administered 2017-06-09: 2.5 mg via RESPIRATORY_TRACT

## 2017-06-09 MED ORDER — AZITHROMYCIN 250 MG PO TABS: ORAL_TABLET | ORAL | 0 refills | Status: DC

## 2017-06-09 MED ORDER — ALBUTEROL SULFATE HFA 108 (90 BASE) MCG/ACT IN AERS: 2.0000 | INHALATION_SPRAY | Freq: Four times a day (QID) | RESPIRATORY_TRACT | 0 refills | Status: DC | PRN

## 2017-06-09 NOTE — Addendum Note (Signed)
Addended by: Gregery NaVALENCIA, Maisa Bedingfield P on: 06/09/2017 01:34 PM   Modules accepted: Orders
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
## 2017-07-26 ENCOUNTER — Other Ambulatory Visit: Payer: Self-pay

## 2017-07-26 DIAGNOSIS — I10 Essential (primary) hypertension: Secondary | ICD-10-CM

## 2017-07-26 MED ORDER — POTASSIUM CHLORIDE ER 8 MEQ PO TBCR: 8.0000 meq | EXTENDED_RELEASE_TABLET | Freq: Every day | ORAL | 0 refills | Status: DC

## 2017-08-08 NOTE — Progress Notes (Deleted)
Name: Dorothy Ritter   MRN: [id number]    DOB: March 08, 1946   Date:08/08/2017       Progress Note  Subjective  Chief Complaint  No chief complaint on file.   HPI   Hypertension -maintained on amlodipine, lasix? ,quinapril potassium Reports daily medication compliance without adverse medication effects. Reports X Denies headaches, vision changes, chest pain, shortness of breath, edema.  BP Readings from Last 3 Encounters:  06/09/17 130/80  12/21/16 (!) 154/88  12/23/15 (!) 150/92   hypothyroidism on levothyroxine  Back Pain-tramadol PRN??   ASA??  B12??-se labs on 6/19  Cholesterol- maintained on pravastatin Reports daily medication compliance without adverse medication effects including myalgias. Diet exercise  Lab Results  Component Value Date   CHOL 172 12/22/2016   HDL 57.80 12/22/2016   LDLCALC 101 (H) 12/22/2016   LDLDIRECT 126.8 01/25/2012   TRIG 65.0 12/22/2016   CHOLHDL 3 12/22/2016       Patient Active Problem List   Diagnosis Date Noted  . Obesity 12/23/2015  . Hyperglycemia 01/25/2012  . Hyperlipidemia 11/22/2009  . ELECTROCARDIOGRAM, ABNORMAL 11/22/2009  . Lumbar radicular pain 09/04/2008  . ANEMIA, VITAMIN B12 DEFICIENCY 11/17/2007  . Hypothyroidism 10/20/2006  . Essential hypertension 10/20/2006    Past Surgical History:  Procedure Laterality Date  . ABDOMINAL HYSTERECTOMY     Dr. Rana SnareLowe, dysfunctional menses  . COLONOSCOPY  2001   lymphoid nodules  . FRACTURE SURGERY Right 1970's   Ankle Fx.  . THYROIDECTOMY     goiter, no malignancy    Family History  Problem Relation Age of Onset  . Heart disease Mother        pacemaker  . Prostate cancer Father   . Cancer Father   . Cancer Sister        oral  . Osteoarthritis Sister   . Diabetes Neg Hx   . Depression Neg Hx   . Stroke Neg Hx     Social History   Socioeconomic History  . Marital status: Married    Spouse name: Not on file  . Number of children: 3  . Years of  education: 4912  . Highest education level: Not on file  Social Needs  . Financial resource strain: Not on file  . Food insecurity - worry: Not on file  . Food insecurity - inability: Not on file  . Transportation needs - medical: Not on file  . Transportation needs - non-medical: Not on file  Occupational History  . Occupation: Retired  Tobacco Use  . Smoking status: Never Smoker  . Smokeless tobacco: Never Used  Substance and Sexual Activity  . Alcohol use: No  . Drug use: No  . Sexual activity: Not on file  Other Topics Concern  . Not on file  Social History Narrative  . Not on file     Current Outpatient Medications:  .  albuterol (PROVENTIL HFA;VENTOLIN HFA) 108 (90 Base) MCG/ACT inhaler, Inhale 2 puffs into the lungs every 6 (six) hours as needed for wheezing or shortness of breath., Disp: 1 Inhaler, Rfl: 0 .  amLODipine (NORVASC) 5 MG tablet, TAKE 1 TABLET BY MOUTH ONCE DAILY, Disp: 90 tablet, Rfl: 0 .  aspirin 325 MG tablet, Take 1 tablet (325 mg total) by mouth daily., Disp: 90 tablet, Rfl: 3 .  azithromycin (ZITHROMAX) 250 MG tablet, 2 tabs po qd x 1 day; 1 tablet per day x 4 days;, Disp: 6 tablet, Rfl: 0 .  furosemide (LASIX) 20 MG tablet, Take 1 tablet (  20 mg total) by mouth 2 (two) times daily. Must keep scheduled appt for Feb w/new provider for refills, Disp: 180 tablet, Rfl: 0 .  levothyroxine (SYNTHROID, LEVOTHROID) 75 MCG tablet, TAKE 1 TABLET BY MOUTH DAILY EXCEPT TAKE 1 1/2 TABLETS ON TUES THURS AND SATURDAY, Disp: 108 tablet, Rfl: 0 .  potassium chloride (KLOR-CON) 8 MEQ tablet, Take 1 tablet (8 mEq total) by mouth daily., Disp: 30 tablet, Rfl: 0 .  pravastatin (PRAVACHOL) 40 MG tablet, Take 1 tablet (40 mg total) by mouth daily. Must keep scheduled appt w/new provider for future refills, Disp: 90 tablet, Rfl: 0 .  quinapril (ACCUPRIL) 40 MG tablet, Take 1.5 tablets (60 mg total) by mouth daily., Disp: 45 tablet, Rfl: 1 .  traMADol (ULTRAM) 50 MG tablet, take 1  tablet by mouth every 8 hours if needed for pain, Disp: 30 tablet, Rfl: 1  No Known Allergies   ROS  ***  Objective  There were no vitals filed for this visit. ***  There is no height or weight on file to calculate BMI.  Physical Exam ***  No results found for this or any previous visit (from the past 2160 hour(s)).  Diabetic Foot Exam: Diabetic Foot Exam - Simple   No data filed     ***  PHQ2/9: Depression screen PHQ 2/9 12/23/2015  Decreased Interest 0  Down, Depressed, Hopeless 0  PHQ - 2 Score 0   ***  Fall Risk: Fall Risk  12/23/2015  Falls in the past year? No   ***   Functional Status Survey:   ***  Assessment & Plan  There are no diagnoses linked to this encounter.  -Reviewed Health Maintenance: ***  Labs: CBC, cmet, b12

## 2017-08-09 ENCOUNTER — Ambulatory Visit: Payer: Medicare Other | Admitting: Nurse Practitioner

## 2017-08-27 ENCOUNTER — Other Ambulatory Visit: Payer: Self-pay

## 2017-08-27 MED ORDER — QUINAPRIL HCL 40 MG PO TABS: 60.0000 mg | ORAL_TABLET | Freq: Every day | ORAL | 1 refills | Status: DC

## 2017-10-26 ENCOUNTER — Ambulatory Visit: Payer: Self-pay | Admitting: *Deleted

## 2017-10-26 NOTE — Telephone Encounter (Signed)
  Reason for Disposition . Health Information question, no triage required and triager able to answer question  Answer Assessment - Initial Assessment Questions 1. REASON FOR CALL or QUESTION: "What is your reason for calling today?" or "How can I best help you?" or "What question do you have that I can help answer?"     Calling regarding dry nostrils.  Protocols used: INFORMATION ONLY CALL-A-AH

## 2017-10-26 NOTE — Telephone Encounter (Signed)
Pt advised on using her humidifier. She stated that she had gotten it out and was using it now. Also to use a little petroleum jelly  to lubricate her nostrils. Advised not to use a lot. Pt voiced understanding and denies any other concerns. She has appointment with A. Shambley tomorrow.

## 2017-10-28 ENCOUNTER — Encounter: Payer: Self-pay | Admitting: Nurse Practitioner

## 2017-10-28 ENCOUNTER — Other Ambulatory Visit (INDEPENDENT_AMBULATORY_CARE_PROVIDER_SITE_OTHER): Payer: Medicare Other

## 2017-10-28 ENCOUNTER — Ambulatory Visit: Payer: Medicare Other | Admitting: Nurse Practitioner

## 2017-10-28 ENCOUNTER — Telehealth: Payer: Self-pay

## 2017-10-28 VITALS — BP 132/78 | HR 68 | Temp 98.6°F | Resp 16 | Ht 65.5 in | Wt 189.0 lb

## 2017-10-28 DIAGNOSIS — R609 Edema, unspecified: Secondary | ICD-10-CM

## 2017-10-28 DIAGNOSIS — D518 Other vitamin B12 deficiency anemias: Secondary | ICD-10-CM | POA: Diagnosis not present

## 2017-10-28 DIAGNOSIS — E039 Hypothyroidism, unspecified: Secondary | ICD-10-CM

## 2017-10-28 DIAGNOSIS — E785 Hyperlipidemia, unspecified: Secondary | ICD-10-CM | POA: Diagnosis not present

## 2017-10-28 DIAGNOSIS — J309 Allergic rhinitis, unspecified: Secondary | ICD-10-CM

## 2017-10-28 DIAGNOSIS — D72820 Lymphocytosis (symptomatic): Secondary | ICD-10-CM | POA: Diagnosis not present

## 2017-10-28 DIAGNOSIS — D510 Vitamin B12 deficiency anemia due to intrinsic factor deficiency: Secondary | ICD-10-CM

## 2017-10-28 DIAGNOSIS — E538 Deficiency of other specified B group vitamins: Secondary | ICD-10-CM

## 2017-10-28 DIAGNOSIS — I10 Essential (primary) hypertension: Secondary | ICD-10-CM | POA: Diagnosis not present

## 2017-10-28 LAB — COMPREHENSIVE METABOLIC PANEL
ALBUMIN: 3.7 g/dL (ref 3.5–5.2)
ALT: 22 U/L (ref 0–35)
AST: 58 U/L — AB (ref 0–37)
Alkaline Phosphatase: 36 U/L — ABNORMAL LOW (ref 39–117)
BUN: 13 mg/dL (ref 6–23)
CHLORIDE: 106 meq/L (ref 96–112)
CO2: 27 mEq/L (ref 19–32)
CREATININE: 0.74 mg/dL (ref 0.40–1.20)
Calcium: 8.3 mg/dL — ABNORMAL LOW (ref 8.4–10.5)
GFR: 98.97 mL/min (ref >60.00)
Glucose, Bld: 91 mg/dL (ref 70–99)
Potassium: 3.7 mEq/L (ref 3.5–5.1)
SODIUM: 140 meq/L (ref 135–145)
Total Bilirubin: 1.2 mg/dL (ref 0.2–1.2)
Total Protein: 6.7 g/dL (ref 6.0–8.3)

## 2017-10-28 LAB — CBC WITH DIFFERENTIAL/PLATELET
BASOS PCT: 0.4 % (ref 0.0–3.0)
Basophils Absolute: 0 10*3/uL (ref 0.0–0.1)
EOS ABS: 0 10*3/uL (ref 0.0–0.7)
Eosinophils Relative: 1.4 % (ref 0.0–5.0)
HCT: 20.1 % — CL (ref 36.0–46.0)
Lymphocytes Relative: 79.8 % — ABNORMAL HIGH (ref 12.0–46.0)
Lymphs Abs: 2.3 10*3/uL (ref 0.7–4.0)
MCHC: 34.6 g/dL (ref 30.0–36.0)
MCV: 135.5 fl — ABNORMAL HIGH (ref 78.0–100.0)
MONO ABS: 0.1 10*3/uL (ref 0.1–1.0)
Monocytes Relative: 2.2 % — ABNORMAL LOW (ref 3.0–12.0)
NEUTROS ABS: 0.5 10*3/uL — AB (ref 1.4–7.7)
NEUTROS PCT: 16.2 % — AB (ref 43.0–77.0)
Platelets: 73 10*3/uL — ABNORMAL LOW (ref 150.0–400.0)
RBC: 1.49 Mil/uL — ABNORMAL LOW (ref 3.87–5.11)
RDW: 24.4 % — AB (ref 11.5–15.5)
WBC: 2.9 10*3/uL — AB (ref 4.0–10.5)

## 2017-10-28 LAB — VITAMIN B12: Vitamin B-12: <50 pg/mL — ABNORMAL LOW (ref 211–911)

## 2017-10-28 LAB — EXTRA LAV TOP TUBE

## 2017-10-28 MED ORDER — AMLODIPINE BESYLATE 5 MG PO TABS: ORAL_TABLET | ORAL | 1 refills | Status: DC

## 2017-10-28 MED ORDER — POTASSIUM CHLORIDE ER 8 MEQ PO TBCR: 8.0000 meq | EXTENDED_RELEASE_TABLET | Freq: Every day | ORAL | 1 refills | Status: DC

## 2017-10-28 MED ORDER — PRAVASTATIN SODIUM 40 MG PO TABS: 40.0000 mg | ORAL_TABLET | Freq: Every day | ORAL | 1 refills | Status: DC

## 2017-10-28 MED ORDER — LEVOTHYROXINE SODIUM 75 MCG PO TABS: ORAL_TABLET | ORAL | 1 refills | Status: DC

## 2017-10-28 MED ORDER — QUINAPRIL HCL 40 MG PO TABS: 60.0000 mg | ORAL_TABLET | Freq: Every day | ORAL | 1 refills | Status: DC

## 2017-10-28 MED ORDER — ASPIRIN 81 MG PO TABS: 81.0000 mg | ORAL_TABLET | Freq: Every day | ORAL | 1 refills | Status: AC

## 2017-10-28 MED ORDER — FUROSEMIDE 20 MG PO TABS: 20.0000 mg | ORAL_TABLET | Freq: Two times a day (BID) | ORAL | 1 refills | Status: DC

## 2017-10-28 MED ORDER — CYANOCOBALAMIN 1000 MCG/ML IJ SOLN
1000.0000 ug | Freq: Once | INTRAMUSCULAR | Status: AC
  Administered 2017-10-28: 1000 ug via INTRAMUSCULAR

## 2017-10-28 NOTE — Assessment & Plan Note (Signed)
Stable, no edema noted today Continue lasix and potassium at current dosages Update [facility] today - Comprehensive metabolic panel; Future - furosemide (LASIX) 20 MG tablet; Take 1 tablet (20 mg total) by mouth 2 (two) times daily. Must keep scheduled appt for Feb w/new provider for refills  Dispense: 180 tablet; Refill: 1 - potassium chloride (KLOR-CON) 8 MEQ tablet; Take 1 tablet (8 mEq total) by mouth daily.  Dispense: 90 tablet; Refill: 1

## 2017-10-28 NOTE — Addendum Note (Signed)
Addended by: Mercer PodWRENN, Armani Gawlik E on: 10/28/2017 04:18 PM   Modules accepted: Orders

## 2017-10-28 NOTE — Assessment & Plan Note (Signed)
Will update labs today and F/U with further recommendations - CBC with Differential/Platelet; Future - Vitamin B12; Future

## 2017-10-28 NOTE — Progress Notes (Signed)
Name: Dorothy Ritter   MRN: [id number]    DOB: Jul 28, 1945   Date:10/28/2017       Progress Note  Subjective  Chief Complaint  Chief Complaint  Patient presents with  . Establish Care    having some blood when blowing nose, review chronic conditions    HPI  Hypertension -maintained on amlodipine 5, quinapril 40- 1.5 tablets daily. She is also maintained on lasix 20 BID for HTN, edema and potassium dialy Reports daily medication compliance without adverse medication effects. Reports she does not routinely check her blood pressure at home. Denies vision changes, chest pain, shortness of breath, edema.  BP Readings from Last 3 Encounters:  10/28/17 132/78  06/09/17 130/80  12/21/16 (!) 154/88   Hypothryoidism-maintained on synthroid 75 tablets daily, 1.5 tablets every other day. Denies fatigue, weight gain, intolerance to hot or cold, palpitations, constipation, depression.  Lab Results  Component Value Date   TSH 2.76 12/22/2016   Cholesterol- maintained on pravastatin 40 and Aspirin 81 daily  Reports daily medication compliance without adverse medication effects including myalgias. She does not have history of heart attack or stroke  Lab Results  Component Value Date   CHOL 172 12/22/2016   HDL 57.80 12/22/2016   LDLCALC 101 (H) 12/22/2016   LDLDIRECT 126.8 01/25/2012   TRIG 65.0 12/22/2016   CHOLHDL 3 12/22/2016   Anemia-  B12 noted on chart review, not currently maintained on B12 She says she received B12 injections in the past and "felt much better" but she is not sure why she stopped receiving the injections She denies any abnormal bruising or bleeding, pallor.  Nosebleeding- This is an acute problem She first noted scant blood to tissue when blowing nose on Monday She has continue to notice light blood when blowing nose since  She also reports watery eyes, rhinorrhea, sneezing, dry cough. She denies fevers, headaches, dizziness, chest pain, shortness of  breath, hemoptysis, abdominal pain, nausea, vomiting. She has been applying vaseline into her nostrils with some improvement    Patient Active Problem List   Diagnosis Date Noted  . Obesity 12/23/2015  . Hyperglycemia 01/25/2012  . Hyperlipidemia 11/22/2009  . ELECTROCARDIOGRAM, ABNORMAL 11/22/2009  . Lumbar radicular pain 09/04/2008  . ANEMIA, VITAMIN B12 DEFICIENCY 11/17/2007  . Hypothyroidism 10/20/2006  . Essential hypertension 10/20/2006    Past Surgical History:  Procedure Laterality Date  . ABDOMINAL HYSTERECTOMY     Dr. Rana Snare, dysfunctional menses  . COLONOSCOPY  2001   lymphoid nodules  . FRACTURE SURGERY Right 1970's   Ankle Fx.  . THYROIDECTOMY     goiter, no malignancy    Family History  Problem Relation Age of Onset  . Heart disease Mother        pacemaker  . Prostate cancer Father   . Cancer Father   . Cancer Sister        oral  . Osteoarthritis Sister   . Diabetes Neg Hx   . Depression Neg Hx   . Stroke Neg Hx     Social History   Socioeconomic History  . Marital status: Married    Spouse name: Not on file  . Number of children: 3  . Years of education: 36  . Highest education level: Not on file  Occupational History  . Occupation: Retired  Engineer, production  . Financial resource strain: Not on file  . Food insecurity:    Worry: Not on file    Inability: Not on file  . Transportation needs:  Medical: Not on file    Non-medical: Not on file  Tobacco Use  . Smoking status: Never Smoker  . Smokeless tobacco: Never Used  Substance and Sexual Activity  . Alcohol use: No  . Drug use: No  . Sexual activity: Not on file  Lifestyle  . Physical activity:    Days per week: Not on file    Minutes per session: Not on file  . Stress: Not on file  Relationships  . Social connections:    Talks on phone: Not on file    Gets together: Not on file    Attends religious service: Not on file    Active member of club or organization: Not on file     Attends meetings of clubs or organizations: Not on file    Relationship status: Not on file  . Intimate partner violence:    Fear of current or ex partner: Not on file    Emotionally abused: Not on file    Physically abused: Not on file    Forced sexual activity: Not on file  Other Topics Concern  . Not on file  Social History Narrative  . Not on file     Current Outpatient Medications:  .  albuterol (PROVENTIL HFA;VENTOLIN HFA) 108 (90 Base) MCG/ACT inhaler, Inhale 2 puffs into the lungs every 6 (six) hours as needed for wheezing or shortness of breath., Disp: 1 Inhaler, Rfl: 0 .  amLODipine (NORVASC) 5 MG tablet, TAKE 1 TABLET BY MOUTH ONCE DAILY, Disp: 90 tablet, Rfl: 1 .  aspirin 81 MG tablet, Take 1 tablet (81 mg total) by mouth daily., Disp: 30 tablet, Rfl: 1 .  furosemide (LASIX) 20 MG tablet, Take 1 tablet (20 mg total) by mouth 2 (two) times daily. Must keep scheduled appt for Feb w/new provider for refills, Disp: 180 tablet, Rfl: 1 .  levothyroxine (SYNTHROID, LEVOTHROID) 75 MCG tablet, TAKE 1 TABLET BY MOUTH DAILY EXCEPT TAKE 1 1/2 TABLETS ON TUES THURS AND SATURDAY, Disp: 306 tablet, Rfl: 1 .  potassium chloride (KLOR-CON) 8 MEQ tablet, Take 1 tablet (8 mEq total) by mouth daily., Disp: 90 tablet, Rfl: 1 .  pravastatin (PRAVACHOL) 40 MG tablet, Take 1 tablet (40 mg total) by mouth daily. Must keep scheduled appt w/new provider for future refills, Disp: 90 tablet, Rfl: 1 .  quinapril (ACCUPRIL) 40 MG tablet, Take 1.5 tablets (60 mg total) by mouth daily., Disp: 135 tablet, Rfl: 1 .  traMADol (ULTRAM) 50 MG tablet, take 1 tablet by mouth every 8 hours if needed for pain, Disp: 30 tablet, Rfl: 1  No Known Allergies   ROS See HPI  Objective  Vitals:   10/28/17 0802  BP: 132/78  Pulse: 68  Resp: 16  Temp: 98.6 F (37 C)  TempSrc: Oral  SpO2: 90%  Weight: 189 lb (85.7 kg)  Height: 5' 5.5" (1.664 m)    Body mass index is 30.97 kg/m.  Physical Exam Vital signs  reviewed Constitutional: Patient appears well-developed and well-nourished. No distress.  HENT: Head: Normocephalic and atraumatic. Ears: Bilateral TMs without erythema or effusion; Nose: mucosal erythema, dryness. Mouth/Throat: Oropharynx is clear and moist. No oropharyngeal exudate.  Eyes: Conjunctivae and EOM are normal. Pupils are equal, round, and reactive to light. No scleral icterus.  Neck: Normal range of motion. Neck supple. No thyromegaly present. No cervical adenopathy. Cardiovascular: Normal rate, regular rhythm and normal heart sounds.  No murmur heard. No BLE edema. Distal pulses intact. Pulmonary/Chest: Effort normal and breath  sounds normal. No respiratory distress. Neurological: She is alert and oriented to person, place, and time. Coordination, balance, strength, speech and gait are normal.  Skin: Skin is warm and dry. No rash noted. No erythema. No pallor Psychiatric: Patient has a normal mood and affect. behavior is normal. Judgment and thought content normal.  Assessment & Plan RTC in 2 months for CPE  -Reviewed Health Maintenance: return in June for CPE   Allergic rhinitis, unspecified seasonality, unspecified trigger Will check CBC for bleeding today but likely due to allergies and dry nasal passages Instructed to start oral antihistamine for allergy symptoms, start saline nasal spray daily and continue vaseline in dry nasal passages Discussed management of nosebleeding and allergies at home including return precautions and printed information on AVS F/U for new, worsening symptoms - CBC with Differential/Platelet; Future

## 2017-10-28 NOTE — Assessment & Plan Note (Signed)
stable, continue current medications Update labs - CBC with Differential/Platelet; Future - Comprehensive metabolic panel; Future - amLODipine (NORVASC) 5 MG tablet; TAKE 1 TABLET BY MOUTH ONCE DAILY  Dispense: 90 tablet; Refill: 1 - quinapril (ACCUPRIL) 40 MG tablet; Take 1.5 tablets (60 mg total) by mouth daily.  Dispense: 135 tablet; Refill: 1 - furosemide (LASIX) 20 MG tablet; Take 1 tablet (20 mg total) by mouth 2 (two) times daily. Must keep scheduled appt for Feb w/new provider for refills  Dispense: 180 tablet; Refill: 1 - potassium chloride (KLOR-CON) 8 MEQ tablet; Take 1 tablet (8 mEq total) by mouth daily.  Dispense: 90 tablet; Refill: 1

## 2017-10-28 NOTE — Patient Instructions (Signed)
For your allergy symptoms and nosebleeding, please continue to apply the vaseline to your nose. Start taking an over the counter allergy medication such as zyrtec or claritin and try using saline nasal spray daily.  Please head downstairs for lab work/x-rays. If any of your test results are critically abnormal, you will be contacted right away. Your results may be released to your MyChart for viewing before I am able to provide you with my response. I will contact you within a week about your test results and any recommendations for abnormalities.  Please plan to return around June for an annual physical with fasting lab work.  It was good to meet you. Thanks for letting me take care of you today :)   Nosebleed A nosebleed is when blood comes out of the nose. Nosebleeds are common. They are usually not a sign of a serious medical problem. Follow these instructions at home: When you have a nosebleed:  Sit down.  Tilt your head a little forward.  Follow these steps: 1. Pinch your nose with a clean towel or tissue. 2. Keep pinching your nose for 10 minutes. Do not let go. 3. After 10 minutes, let go of your nose. 4. If there is still bleeding, do these steps again. Keep doing these steps until the bleeding stops.  Do not put things in your nose to stop the bleeding.  Try not to lie down or put your head back.  Use a nose spray decongestant as told by your doctor.  Do not use petroleum jelly or mineral oil in your nose. These things can get into your lungs. After a nosebleed:  Try not to blow your nose or sniffle for several hours.  Try not to strain, lift, or bend at the waist for several days.  Use saline spray or a humidifier as told by your doctor.  Aspirin and blood-thinning medicines make bleeding more likely. If you take these medicines, ask your doctor if you should stop taking them, or if you should change how much you take. Do not stop taking the medicine unless your  doctor tells you to. Contact a doctor if:  You have a fever.  You get nosebleeds often.  You are getting nosebleeds more often than usual.  You bruise very easily.  You have something stuck in your nose.  You have bleeding in your mouth.  You throw up (vomit) or cough up brown material.  You get a nosebleed after you start a new medicine. Get help right away if:  You have a nosebleed after you fall or hurt your head.  Your nosebleed does not go away after 20 minutes.  You feel dizzy or weak.  You have unusual bleeding from other parts of your body.  You have unusual bruising on other parts of your body.  You get sweaty.  You throw up blood. Summary  Nosebleeds are common. They are usually not a sign of a serious medical problem.  When you have a nosebleed, sit down and tilt your head a little forward. Pinch your nose with a clean tissue.  After the bleeding stops, try not to blow your nose or sniffle for several hours. This information is not intended to replace advice given to you by your health care provider. Make sure you discuss any questions you have with your health care provider. Document Released: 03/31/2008 Document Revised: 10/02/2016 Document Reviewed: 10/02/2016 Elsevier Interactive Patient Education  2018 [employer]

## 2017-10-28 NOTE — Assessment & Plan Note (Signed)
stable, continue current medications. Labs are up to date - levothyroxine (SYNTHROID, LEVOTHROID) 75 MCG tablet; TAKE 1 TABLET BY MOUTH DAILY EXCEPT TAKE 1 1/2 TABLETS ON TUES THURS AND SATURDAY  Dispense: 306 tablet; Refill: 1

## 2017-10-28 NOTE — Assessment & Plan Note (Signed)
Stable, continue current meds Labs are up to date - pravastatin (PRAVACHOL) 40 MG tablet; Take 1 tablet (40 mg total) by mouth daily. Must keep scheduled appt w/new provider for future refills  Dispense: 90 tablet; Refill: 1 - aspirin 81 MG tablet; Take 1 tablet (81 mg total) by mouth daily.  Dispense: 30 tablet; Refill: 1

## 2017-10-28 NOTE — Telephone Encounter (Signed)
Will send result note.

## 2017-10-28 NOTE — Telephone Encounter (Signed)
Received call from lab for critical lab value of   Hemoglobin-7 Hematocrit- 20   Please advise

## 2017-10-29 ENCOUNTER — Telehealth: Payer: Self-pay | Admitting: Nurse Practitioner

## 2017-10-29 ENCOUNTER — Ambulatory Visit: Payer: Self-pay

## 2017-10-29 ENCOUNTER — Other Ambulatory Visit: Payer: Self-pay | Admitting: Nurse Practitioner

## 2017-10-29 ENCOUNTER — Ambulatory Visit: Payer: Medicare Other

## 2017-10-29 ENCOUNTER — Encounter (HOSPITAL_COMMUNITY): Payer: Self-pay | Admitting: *Deleted

## 2017-10-29 ENCOUNTER — Emergency Department (HOSPITAL_COMMUNITY)
Admission: EM | Admit: 2017-10-29 | Discharge: 2017-10-29 | Disposition: A | Discharge status: Home / Self Care | Payer: Medicare Other | Attending: Emergency Medicine | Admitting: Emergency Medicine

## 2017-10-29 DIAGNOSIS — I10 Essential (primary) hypertension: Secondary | ICD-10-CM | POA: Diagnosis not present

## 2017-10-29 DIAGNOSIS — L299 Pruritus, unspecified: Secondary | ICD-10-CM | POA: Diagnosis not present

## 2017-10-29 DIAGNOSIS — T7840XA Allergy, unspecified, initial encounter: Secondary | ICD-10-CM | POA: Insufficient documentation

## 2017-10-29 DIAGNOSIS — T452X5A Adverse effect of vitamins, initial encounter: Secondary | ICD-10-CM | POA: Diagnosis not present

## 2017-10-29 DIAGNOSIS — D61818 Other pancytopenia: Secondary | ICD-10-CM | POA: Diagnosis not present

## 2017-10-29 DIAGNOSIS — R21 Rash and other nonspecific skin eruption: Secondary | ICD-10-CM | POA: Insufficient documentation

## 2017-10-29 DIAGNOSIS — R7989 Other specified abnormal findings of blood chemistry: Secondary | ICD-10-CM

## 2017-10-29 DIAGNOSIS — Z79899 Other long term (current) drug therapy: Secondary | ICD-10-CM | POA: Insufficient documentation

## 2017-10-29 DIAGNOSIS — R799 Abnormal finding of blood chemistry, unspecified: Secondary | ICD-10-CM | POA: Diagnosis present

## 2017-10-29 LAB — CBC WITH DIFFERENTIAL/PLATELET
Basophils Absolute: 0 10*3/uL (ref 0.0–0.1)
Basophils Relative: 0 %
Eosinophils Absolute: 0 10*3/uL (ref 0.0–0.7)
Eosinophils Relative: 1 %
HCT: 21.2 % — ABNORMAL LOW (ref 36.0–46.0)
Hemoglobin: 7.2 g/dL — ABNORMAL LOW (ref 12.0–15.0)
Lymphocytes Relative: 77 %
Lymphs Abs: 2.3 10*3/uL (ref 0.7–4.0)
MCH: 43.4 pg — ABNORMAL HIGH (ref 26.0–34.0)
MCHC: 34 g/dL (ref 30.0–36.0)
MCV: 127.7 fL — ABNORMAL HIGH (ref 78.0–100.0)
Monocytes Absolute: 0.2 10*3/uL (ref 0.1–1.0)
Monocytes Relative: 6 %
Neutro Abs: 0.5 10*3/uL — ABNORMAL LOW (ref 1.7–7.7)
Neutrophils Relative %: 16 %
Platelets: 71 10*3/uL — ABNORMAL LOW (ref 150–400)
RBC: 1.66 MIL/uL — ABNORMAL LOW (ref 3.87–5.11)
RDW: 27.2 % — ABNORMAL HIGH (ref 11.5–15.5)
WBC: 3 10*3/uL — ABNORMAL LOW (ref 4.0–10.5)

## 2017-10-29 LAB — COMPREHENSIVE METABOLIC PANEL
ALT: 28 U/L (ref 14–54)
AST: 83 U/L — ABNORMAL HIGH (ref 15–41)
Albumin: 3.9 g/dL (ref 3.5–5.0)
Alkaline Phosphatase: 36 U/L — ABNORMAL LOW (ref 38–126)
Anion gap: 10 (ref 5–15)
BUN: 11 mg/dL (ref 6–20)
CO2: 22 mmol/L (ref 22–32)
Calcium: 8 mg/dL — ABNORMAL LOW (ref 8.9–10.3)
Chloride: 107 mmol/L (ref 101–111)
Creatinine, Ser: 0.85 mg/dL (ref 0.44–1.00)
GFR calc Af Amer: >60 mL/min (ref >60)
GFR calc non Af Amer: >60 mL/min (ref >60)
Glucose, Bld: 90 mg/dL (ref 65–99)
Potassium: 3.5 mmol/L (ref 3.5–5.1)
Sodium: 139 mmol/L (ref 135–145)
Total Bilirubin: 1.2 mg/dL (ref 0.3–1.2)
Total Protein: 7.4 g/dL (ref 6.5–8.1)

## 2017-10-29 LAB — RETICULOCYTES
RBC.: 1.57 MIL/uL — ABNORMAL LOW (ref 3.87–5.11)
RETIC CT PCT: 2.4 % (ref 0.4–3.1)
Retic Count, Absolute: 37.7 10*3/uL (ref 19.0–186.0)

## 2017-10-29 LAB — FOLATE: Folate: 13.6 ng/mL (ref >5.9)

## 2017-10-29 LAB — IRON AND TIBC
Iron: 52 ug/dL (ref 28–170)
SATURATION RATIOS: 22 % (ref 10.4–31.8)
TIBC: 234 ug/dL — AB (ref 250–450)
UIBC: 182 ug/dL

## 2017-10-29 LAB — ABO/RH: ABO/RH(D): A POS

## 2017-10-29 LAB — VITAMIN B12: Vitamin B-12: 3305 pg/mL — ABNORMAL HIGH (ref 180–914)

## 2017-10-29 LAB — TYPE AND SCREEN
ABO/RH(D): A POS
Antibody Screen: NEGATIVE

## 2017-10-29 LAB — PATHOLOGIST SMEAR REVIEW

## 2017-10-29 LAB — FERRITIN: FERRITIN: 245 ng/mL (ref 11–307)

## 2017-10-29 LAB — POC OCCULT BLOOD, ED: Fecal Occult Bld: NEGATIVE

## 2017-10-29 MED ORDER — PREDNISONE 20 MG PO TABS
60.0000 mg | ORAL_TABLET | Freq: Once | ORAL | Status: AC
  Administered 2017-10-29: 60 mg via ORAL
  Filled 2017-10-29: qty 3

## 2017-10-29 MED ORDER — FERROUS SULFATE 325 (65 FE) MG PO TABS: 325.0000 mg | ORAL_TABLET | Freq: Every day | ORAL | 0 refills | Status: DC

## 2017-10-29 MED ORDER — DIPHENHYDRAMINE HCL 25 MG PO CAPS
25.0000 mg | ORAL_CAPSULE | Freq: Once | ORAL | Status: AC
  Administered 2017-10-29: 25 mg via ORAL
  Filled 2017-10-29: qty 1

## 2017-10-29 MED ORDER — PREDNISONE 20 MG PO TABS: 40.0000 mg | ORAL_TABLET | Freq: Every day | ORAL | 0 refills | Status: DC
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
## 2017-10-29 NOTE — Telephone Encounter (Signed)
Patient called in with c/o "rash after receiving B12 injection." She says "I came home and around 6 pm I noticed a rash and itching on my arm. I woke up and noticed a rash on my back, my thighs and itching. It woke me up because it felt like my leg was on fire. I don't have a fever." I asked about other symptoms, she says "no, just the itching and hives." Patient has an appointment today to receive another injection. I advised she will receive a call from the office with recommendations, she verbalized understanding. I called the office and spoke with Colon Brancharson, [facility], she said the patient will be called.  Reason for Disposition . Caller has NON-URGENT medication question about med that PCP prescribed and triager unable to answer question  Answer Assessment - Initial Assessment Questions 1. SYMPTOMS: "What is the main symptom?" (e.g., redness, swelling, pain)      Itching, rash to various parts of body 2. ONSET: "When was the vaccine (shot) given?" "How much later did the __________ begin?" (e.g., hours, days ago)     Yesterday around 2p or after.; noticed rash and itching around 6 pm 3. SEVERITY: "How bad is it?"      Moderate 4. FEVER: "Is there a fever?" If so, ask: "What is it, how was it measured, and when did it start?"      No 5. IMMUNIZATIONS GIVEN: "What shots have you recently received?"      6. PAST REACTIONS: "Have you reacted to immunizations before?" If so, ask: "What happened?"      7. OTHER SYMPTOMS: "Do you have any other symptoms?"  Answer Assessment - Initial Assessment Questions 1. SYMPTOMS: "Do you have any symptoms?"    Widespread rash, itching 2. SEVERITY: If symptoms are present, ask "Are they mild, moderate or severe?"     Moderate-severe  Protocols used: MEDICATION QUESTION CALL-A-AH, IMMUNIZATION REACTIONS-A-AH

## 2017-10-29 NOTE — ED Triage Notes (Signed)
Pt sent from [facility] for low hemoglobin. Pt had blood drawn yesterday. Pt complains of itching since last night, pt states she received a B12 shot yesterday.

## 2017-10-29 NOTE — Telephone Encounter (Signed)
I have placed an urgent referral to hematology for further evaluation of her abnormal blood counts Please have her call if she has not heard about this referral by Tuesday afternoon 4/30 Also I have placed an order for a repeat CBC- please have her stop by the lab sometime on Monday 4/29 to have this drawn  

## 2017-10-29 NOTE — Telephone Encounter (Signed)
Per laura,NP and [name],NP---patient should not be experiencing a reaction to B12 injection---I have verified that patient is not under any distress, no difficulty breathing, swallowing or throat swelling---patient reports she is not dizzy, and is ambulating well---since her labs have worsened ---we are recommending patient go to ED for blood transfusion---patient advised and patient will call her daughter to have her go with her--patient is going to [place] ED, I have called Susan/Charge nurse at ED to advise patient will be coming---

## 2017-10-29 NOTE — Discharge Instructions (Signed)
You can take Benadryl every 6 hours as needed for itching.  Continue the prednisone for 4 days.  Take the first dose tomorrow.  Take iron until seeing your doctor in follow-up

## 2017-10-31 ENCOUNTER — Emergency Department (HOSPITAL_COMMUNITY)
Admission: EM | Admit: 2017-10-31 | Discharge: 2017-10-31 | Disposition: A | Discharge status: Home / Self Care | Payer: Medicare Other | Attending: Emergency Medicine | Admitting: Emergency Medicine

## 2017-10-31 ENCOUNTER — Encounter (HOSPITAL_COMMUNITY): Payer: Self-pay | Admitting: Emergency Medicine

## 2017-10-31 DIAGNOSIS — I10 Essential (primary) hypertension: Secondary | ICD-10-CM | POA: Diagnosis not present

## 2017-10-31 DIAGNOSIS — E039 Hypothyroidism, unspecified: Secondary | ICD-10-CM | POA: Insufficient documentation

## 2017-10-31 DIAGNOSIS — H5789 Other specified disorders of eye and adnexa: Secondary | ICD-10-CM | POA: Insufficient documentation

## 2017-10-31 DIAGNOSIS — D519 Vitamin B12 deficiency anemia, unspecified: Secondary | ICD-10-CM

## 2017-10-31 DIAGNOSIS — L509 Urticaria, unspecified: Secondary | ICD-10-CM | POA: Diagnosis not present

## 2017-10-31 DIAGNOSIS — D510 Vitamin B12 deficiency anemia due to intrinsic factor deficiency: Secondary | ICD-10-CM | POA: Diagnosis not present

## 2017-10-31 DIAGNOSIS — Z79899 Other long term (current) drug therapy: Secondary | ICD-10-CM | POA: Diagnosis not present

## 2017-10-31 DIAGNOSIS — Z7982 Long term (current) use of aspirin: Secondary | ICD-10-CM | POA: Diagnosis not present

## 2017-10-31 DIAGNOSIS — R21 Rash and other nonspecific skin eruption: Secondary | ICD-10-CM | POA: Diagnosis present

## 2017-10-31 LAB — CBC WITH DIFFERENTIAL/PLATELET
Band Neutrophils: 2 %
Basophils Absolute: 0 10*3/uL (ref 0.0–0.1)
Basophils Relative: 0 %
EOS ABS: 0 10*3/uL (ref 0.0–0.7)
Eosinophils Relative: 0 %
HCT: 22 % — ABNORMAL LOW (ref 36.0–46.0)
Hemoglobin: 7.6 g/dL — ABNORMAL LOW (ref 12.0–15.0)
LYMPHS ABS: 3.5 10*3/uL (ref 0.7–4.0)
Lymphocytes Relative: 62 %
MCH: 44.4 pg — AB (ref 26.0–34.0)
MCHC: 34.5 g/dL (ref 30.0–36.0)
MCV: 128.7 fL — AB (ref 78.0–100.0)
METAMYELOCYTES PCT: 1 %
Monocytes Absolute: 0.8 10*3/uL (ref 0.1–1.0)
Monocytes Relative: 15 %
NEUTROS ABS: 1.3 10*3/uL — AB (ref 1.7–7.7)
NEUTROS PCT: 20 %
PLATELETS: 71 10*3/uL — AB (ref 150–400)
RBC: 1.71 MIL/uL — AB (ref 3.87–5.11)
RDW: 28.1 % — ABNORMAL HIGH (ref 11.5–15.5)
WBC: 5.6 10*3/uL (ref 4.0–10.5)

## 2017-10-31 LAB — COMPREHENSIVE METABOLIC PANEL
ALT: 24 U/L (ref 14–54)
ANION GAP: 9 (ref 5–15)
AST: 41 U/L (ref 15–41)
Albumin: 4.2 g/dL (ref 3.5–5.0)
Alkaline Phosphatase: 35 U/L — ABNORMAL LOW (ref 38–126)
BUN: 16 mg/dL (ref 6–20)
CO2: 25 mmol/L (ref 22–32)
Calcium: 8.5 mg/dL — ABNORMAL LOW (ref 8.9–10.3)
Chloride: 107 mmol/L (ref 101–111)
Creatinine, Ser: 0.89 mg/dL (ref 0.44–1.00)
Glucose, Bld: 101 mg/dL — ABNORMAL HIGH (ref 65–99)
Potassium: 3.2 mmol/L — ABNORMAL LOW (ref 3.5–5.1)
SODIUM: 141 mmol/L (ref 135–145)
Total Bilirubin: 1.1 mg/dL (ref 0.3–1.2)
Total Protein: 7.4 g/dL (ref 6.5–8.1)

## 2017-10-31 LAB — LACTATE DEHYDROGENASE: LDH: 1579 U/L — ABNORMAL HIGH (ref 98–192)

## 2017-10-31 MED ORDER — PREDNISONE 20 MG PO TABS
60.0000 mg | ORAL_TABLET | Freq: Once | ORAL | Status: AC
  Administered 2017-10-31: 60 mg via ORAL
  Filled 2017-10-31: qty 3

## 2017-10-31 MED ORDER — FAMOTIDINE IN NACL 20-0.9 MG/50ML-% IV SOLN
20.0000 mg | Freq: Once | INTRAVENOUS | Status: AC
  Administered 2017-10-31: 20 mg via INTRAVENOUS
  Filled 2017-10-31: qty 50

## 2017-10-31 MED ORDER — FLUORESCEIN SODIUM 1 MG OP STRP
1.0000 | ORAL_STRIP | Freq: Once | OPHTHALMIC | Status: AC
  Administered 2017-10-31: 1 via OPHTHALMIC
  Filled 2017-10-31: qty 1

## 2017-10-31 MED ORDER — FAMOTIDINE 20 MG PO TABS
20.0000 mg | ORAL_TABLET | Freq: Once | ORAL | Status: AC
  Administered 2017-10-31: 20 mg via ORAL
  Filled 2017-10-31: qty 1

## 2017-10-31 MED ORDER — DIPHENHYDRAMINE HCL 50 MG/ML IJ SOLN
25.0000 mg | Freq: Once | INTRAMUSCULAR | Status: AC
  Administered 2017-10-31: 25 mg via INTRAVENOUS
  Filled 2017-10-31: qty 1

## 2017-10-31 MED ORDER — POTASSIUM CHLORIDE CRYS ER 20 MEQ PO TBCR
40.0000 meq | EXTENDED_RELEASE_TABLET | Freq: Once | ORAL | Status: AC
  Administered 2017-10-31: 40 meq via ORAL
  Filled 2017-10-31: qty 2

## 2017-10-31 MED ORDER — TETRACAINE HCL 0.5 % OP SOLN
2.0000 [drp] | Freq: Once | OPHTHALMIC | Status: AC
  Administered 2017-10-31: 2 [drp] via OPHTHALMIC
  Filled 2017-10-31: qty 4

## 2017-10-31 MED ORDER — DIPHENHYDRAMINE HCL 25 MG PO CAPS
50.0000 mg | ORAL_CAPSULE | Freq: Once | ORAL | Status: AC
  Administered 2017-10-31: 50 mg via ORAL
  Filled 2017-10-31: qty 2
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
## 2017-10-31 NOTE — ED Triage Notes (Signed)
Pt was here on Friday for allergic reaction after B12 shot. Pt states that her hives have gotten worse on arms and legs. Pt reports yesterday morning when she woke up she noticed her right eye was red and itching and burning.

## 2017-10-31 NOTE — Discharge Instructions (Addendum)
Take steroids as prescribed. You can take 25-50 mg of benadryl every 6 hours as needed for itching. You are still anemic, but this is improving as are your other cell lines. Your hemoglobin has slowly improved from 7.0 to 7.2 to 7.6 today. I suspect this isfrom severe b12 deficiency and will continue to improve with time. You still need to follow-up with your PCP later this week.

## 2017-10-31 NOTE — ED Provider Notes (Addendum)
MSE was initiated and I personally evaluated the patient and placed orders (if any) at  3:59 PM on October 31, 2017.  HPI: 72 year old with history of pernicious anemia here for worsening urticarial rash x3 days.  Seen in the ED 2 days ago for urticarial rash to upper extremities only, was given prednisone and Benadryl in the ED and discharged with prescription for both.  Also noted to be anemic Hgb 7 then but asymptomatic not requiring transfusion. Woke up this morning with rash now onto chest, abdomen, lower extremities.  This morning also noted painless  right eye redness.  She denies any eye trauma, drainage, eye pain.  Patient has had a lapse in her vitamin B12 injections for several months, new PCP just administered vit B12 injection on 4/25 before osnet rash. EDP suspected urticarial rash likely secondary to vitamin B12 injection.  She has had multiple vitamin B12 injections in the past however without similar reaction. Daughter states pt has been complaining of exertional SOB and feeling more fatigue over several weeks. No CP.   Exam: Temp 97.6 F, 80, 154/73, 20, 98%. Diffuse urticarial rash to upper extremities, inframammary folds, thighs, knees and lower legs. RRR. Lungs CTAB. No tongue or oropharyngeal edema.  Normal WOB. Fluorscein uptake to right lateral eye.  R IOP 17.95. L IOP 20.95    Plan: Hgb 7.0 on 4/26. CBC, CMP ordered. Prednisone, benadryl, pepcid PO given.  Patient deemed adequate to wait in lobby. The patient appears stable so that the remainder of the MSE may be completed by another provider. Will need eye exam.      Liberty Handy, PA-C 10/31/17 1646    Rolland Porter, MD 11/06/17 [phone]

## 2017-11-01 ENCOUNTER — Telehealth: Payer: Self-pay | Admitting: Internal Medicine

## 2017-11-01 LAB — PATHOLOGIST SMEAR REVIEW

## 2017-11-01 NOTE — Telephone Encounter (Signed)
Spoke with pt about the response below. States she was just at the ED yesterday and had CBC done. Dorothy Ritter stated she does not need to come back again today since it was just done yesterday. Pt is aware of referral and I advised be listening out for their call within the next day or so and let us know if she does not hear anything. Pt understood. I advised we will call back with any changes made with B12.

## 2017-11-01 NOTE — Telephone Encounter (Signed)
Received a call from [place] at [place] to schedule the pt for an urgent appt. Lawson Fiscal said the pt went to the ED on 4/28, but no transfusion was done. Pt has been scheduled to see Dr. Arbutus Ped on 5/2 at 1130am. Aware that the pt should arrive 30 minutes early. Lawson Fiscal will notify the pt.

## 2017-11-04 ENCOUNTER — Inpatient Hospital Stay: Payer: Medicare Other | Attending: Internal Medicine | Admitting: Internal Medicine

## 2017-11-04 ENCOUNTER — Encounter: Payer: Self-pay | Admitting: Internal Medicine

## 2017-11-04 ENCOUNTER — Inpatient Hospital Stay: Payer: Medicare Other

## 2017-11-04 ENCOUNTER — Telehealth: Payer: Self-pay

## 2017-11-04 VITALS — BP 148/75 | HR 87 | Temp 98.6°F | Resp 17 | Ht 65.5 in | Wt 187.4 lb

## 2017-11-04 DIAGNOSIS — R634 Abnormal weight loss: Secondary | ICD-10-CM | POA: Diagnosis not present

## 2017-11-04 DIAGNOSIS — D510 Vitamin B12 deficiency anemia due to intrinsic factor deficiency: Secondary | ICD-10-CM | POA: Diagnosis not present

## 2017-11-04 DIAGNOSIS — I10 Essential (primary) hypertension: Secondary | ICD-10-CM

## 2017-11-04 DIAGNOSIS — D531 Other megaloblastic anemias, not elsewhere classified: Secondary | ICD-10-CM

## 2017-11-04 DIAGNOSIS — R0609 Other forms of dyspnea: Secondary | ICD-10-CM | POA: Diagnosis not present

## 2017-11-04 DIAGNOSIS — Z809 Family history of malignant neoplasm, unspecified: Secondary | ICD-10-CM | POA: Diagnosis not present

## 2017-11-04 DIAGNOSIS — Z808 Family history of malignant neoplasm of other organs or systems: Secondary | ICD-10-CM | POA: Insufficient documentation

## 2017-11-04 DIAGNOSIS — E538 Deficiency of other specified B group vitamins: Secondary | ICD-10-CM | POA: Insufficient documentation

## 2017-11-04 DIAGNOSIS — D61818 Other pancytopenia: Secondary | ICD-10-CM | POA: Diagnosis not present

## 2017-11-04 DIAGNOSIS — E039 Hypothyroidism, unspecified: Secondary | ICD-10-CM | POA: Diagnosis not present

## 2017-11-04 DIAGNOSIS — Z8042 Family history of malignant neoplasm of prostate: Secondary | ICD-10-CM | POA: Diagnosis not present

## 2017-11-04 LAB — CBC WITH DIFFERENTIAL (CANCER CENTER ONLY)
BASOS ABS: 0 10*3/uL (ref 0.0–0.1)
Basophils Relative: 0 %
EOS ABS: 0 10*3/uL (ref 0.0–0.5)
Eosinophils Relative: 0 %
HEMATOCRIT: 25.1 % — AB (ref 34.8–46.6)
Hemoglobin: 8.1 g/dL — ABNORMAL LOW (ref 11.6–15.9)
LYMPHS ABS: 1.4 10*3/uL (ref 0.9–3.3)
Lymphocytes Relative: 27 %
MCH: 41.8 pg — AB (ref 25.1–34.0)
MCHC: 32.3 g/dL (ref 31.5–36.0)
MCV: 129.4 fL — AB (ref 79.5–101.0)
MONO ABS: 1.2 10*3/uL — AB (ref 0.1–0.9)
MONOS PCT: 22 %
Neutro Abs: 2.8 10*3/uL (ref 1.5–6.5)
Neutrophils Relative %: 51 %
Platelet Count: 224 10*3/uL (ref 145–400)
RBC: 1.94 MIL/uL — ABNORMAL LOW (ref 3.70–5.45)
RDW: 24.2 % — AB (ref 11.2–14.5)
WBC Count: 5.3 10*3/uL (ref 3.9–10.3)
nRBC: 2 /100 WBC — ABNORMAL HIGH

## 2017-11-04 LAB — CMP (CANCER CENTER ONLY)
ALK PHOS: 38 U/L — AB (ref 40–150)
ALT: 20 U/L (ref 0–55)
AST: 21 U/L (ref 5–34)
Albumin: 3.9 g/dL (ref 3.5–5.0)
Anion gap: 7 (ref 3–11)
BILIRUBIN TOTAL: 0.7 mg/dL (ref 0.2–1.2)
BUN: 21 mg/dL (ref 7–26)
CALCIUM: 8.3 mg/dL — AB (ref 8.4–10.4)
CO2: 28 mmol/L (ref 22–29)
CREATININE: 0.91 mg/dL (ref 0.60–1.10)
Chloride: 106 mmol/L (ref 98–109)
GFR, Est AFR Am: >60 mL/min (ref >60)
GFR, Estimated: >60 mL/min (ref >60)
GLUCOSE: 95 mg/dL (ref 70–140)
Potassium: 3.7 mmol/L (ref 3.5–5.1)
Sodium: 141 mmol/L (ref 136–145)
TOTAL PROTEIN: 7 g/dL (ref 6.4–8.3)

## 2017-11-04 LAB — RETICULOCYTES
RBC.: 1.94 MIL/uL — ABNORMAL LOW (ref 3.70–5.45)
Retic Count, Absolute: 225 10*3/uL — ABNORMAL HIGH (ref 33.7–90.7)
Retic Ct Pct: 11.6 % — ABNORMAL HIGH (ref 0.7–2.1)

## 2017-11-04 LAB — LACTATE DEHYDROGENASE: LDH: 1109 U/L — ABNORMAL HIGH (ref 125–245)

## 2017-11-04 LAB — FOLATE: Folate: 9.2 ng/mL (ref >5.9)

## 2017-11-04 LAB — VITAMIN B12: Vitamin B-12: 210 pg/mL (ref 180–914)

## 2017-11-04 NOTE — Telephone Encounter (Signed)
Per 5/2 los printed avs and calender. Checked patient in for lab addon.
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
## 2017-11-05 ENCOUNTER — Other Ambulatory Visit: Payer: Self-pay | Admitting: Family

## 2017-11-05 ENCOUNTER — Telehealth: Payer: Self-pay | Admitting: *Deleted

## 2017-11-05 ENCOUNTER — Ambulatory Visit: Payer: Medicare Other | Admitting: *Deleted

## 2017-11-05 MED ORDER — PREDNISONE 50 MG PO TABS: ORAL_TABLET | ORAL | 0 refills | Status: DC

## 2017-11-05 MED ORDER — DIPHENHYDRAMINE HCL 50 MG PO TABS: ORAL_TABLET | ORAL | 0 refills | Status: DC

## 2017-11-05 NOTE — Telephone Encounter (Signed)
I sent in the prednisone and Benadryl as recommended by oncologist; they may have to just buy the Benadryl OTC;

## 2017-11-05 NOTE — Telephone Encounter (Signed)
Dorothy Ritter, I spoke w/Dr. Arbutus Ped nurse and she advise that pt can still get the injection here since order by [facility], but pt will need to be premedicated prior to receiving the injection. Normally they take prednisone 50 mg and benadryl 50 mg a hour before getting injection or CT. Pt want to have rx sent to her pharmacy because she does not have med.Marland KitchenRaechel Chute

## 2017-11-05 NOTE — Telephone Encounter (Signed)
Tried Research officer, [employer] (diane) back was not able to get through. Sending msg electronically to see if Dr. Arbutus Ped will proceed w/giving injections, if not how much prednisone and benadryl pt need to take prior to getting injection.Marland KitchenRaechel Chute

## 2017-11-05 NOTE — Telephone Encounter (Signed)
Left message to return call. Per Arbutus Ped note, " I would recommend proceeding with another injection of vitamin B12 level with close monitoring or premedication with prednisone and Benadryl before the injection.". Cc [employer]

## 2017-11-05 NOTE — Telephone Encounter (Signed)
Spoke to [place] and told her Mohameds rec in his note.

## 2017-11-05 NOTE — Telephone Encounter (Signed)
Pt came in to receive vitamin b12, but is questioning if she need to get here, or have her hematologist (Dr. Arbutus Ped) office give it to her since she had a medciation reaction when she received her first one. Pt states she ended up in the ER twice last weekend. Pt states she saw Dr. Arbutus Ped on yesterday and he inform her that she would benefit from getting the B12 injections. Since Apolonio Schneiders is out of the office Ria Clock, NP went and spoke w/the patient. We have called Dr. Arbutus Ped office to clarify but had to leave a msg. Inform pt/daughter once we talk w/dr. Arbutus Ped office to see what to do regarding the B12 will give them a call back. Daughter (angela) want [place] to call her cell at [phone].Marland KitchenRaechel Chute

## 2017-11-07 LAB — METHYLMALONIC ACID, SERUM: METHYLMALONIC ACID, QUANTITATIVE: 4280 nmol/L — AB (ref 0–378)

## 2017-11-08 NOTE — Telephone Encounter (Signed)
Called pt daughter no answer LMOM w/[name] response.Marland KitchenRaechel Ritter

## 2017-11-12 ENCOUNTER — Encounter: Payer: Self-pay | Admitting: *Deleted

## 2017-11-12 ENCOUNTER — Ambulatory Visit (INDEPENDENT_AMBULATORY_CARE_PROVIDER_SITE_OTHER): Payer: Medicare Other | Admitting: *Deleted

## 2017-11-12 DIAGNOSIS — D518 Other vitamin B12 deficiency anemias: Secondary | ICD-10-CM

## 2017-11-12 MED ORDER — CYANOCOBALAMIN 1000 MCG/ML IJ SOLN
1000.0000 ug | Freq: Once | INTRAMUSCULAR | Status: AC
  Administered 2017-11-12: 1000 ug via INTRAMUSCULAR

## 2017-11-16 ENCOUNTER — Ambulatory Visit: Payer: Medicare Other | Admitting: Nurse Practitioner

## 2017-11-16 ENCOUNTER — Encounter: Payer: Self-pay | Admitting: Nurse Practitioner

## 2017-11-16 VITALS — BP 120/70 | HR 80 | Ht 65.0 in | Wt 188.0 lb

## 2017-11-16 DIAGNOSIS — D649 Anemia, unspecified: Secondary | ICD-10-CM | POA: Diagnosis not present

## 2017-11-16 DIAGNOSIS — Z1211 Encounter for screening for malignant neoplasm of colon: Secondary | ICD-10-CM | POA: Diagnosis not present

## 2017-11-16 NOTE — Patient Instructions (Signed)
If you are age 73 or older, your body mass index should be between 23-30. Your Body mass index is 31.28 kg/m. If this is out of the aforementioned range listed, please consider follow up with your Primary Care Provider.  If you are age 31 or younger, your body mass index should be between 19-25. Your Body mass index is 31.28 kg/m. If this is out of the aformentioned range listed, please consider follow up with your Primary Care Provider.   Follow up as needed.  Thank you for choosing me and [facility].   Willette Cluster, NP
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
## 2017-11-18 ENCOUNTER — Encounter: Payer: Self-pay | Admitting: Nurse Practitioner

## 2017-11-18 ENCOUNTER — Telehealth: Payer: Self-pay | Admitting: Nurse Practitioner

## 2017-11-18 ENCOUNTER — Inpatient Hospital Stay: Payer: Medicare Other

## 2017-11-18 ENCOUNTER — Inpatient Hospital Stay (HOSPITAL_BASED_OUTPATIENT_CLINIC_OR_DEPARTMENT_OTHER): Payer: Medicare Other | Admitting: Nurse Practitioner

## 2017-11-18 VITALS — BP 140/78 | HR 82 | Temp 98.7°F | Resp 18 | Ht 65.0 in | Wt 188.5 lb

## 2017-11-18 DIAGNOSIS — E538 Deficiency of other specified B group vitamins: Secondary | ICD-10-CM

## 2017-11-18 DIAGNOSIS — D531 Other megaloblastic anemias, not elsewhere classified: Secondary | ICD-10-CM

## 2017-11-18 DIAGNOSIS — D510 Vitamin B12 deficiency anemia due to intrinsic factor deficiency: Secondary | ICD-10-CM

## 2017-11-18 DIAGNOSIS — D518 Other vitamin B12 deficiency anemias: Secondary | ICD-10-CM | POA: Diagnosis not present

## 2017-11-18 LAB — CBC WITH DIFFERENTIAL (CANCER CENTER ONLY)
Basophils Absolute: 0.1 10*3/uL (ref 0.0–0.1)
Basophils Relative: 2 %
EOS ABS: 0.2 10*3/uL (ref 0.0–0.5)
Eosinophils Relative: 3 %
HEMATOCRIT: 27.6 % — AB (ref 34.8–46.6)
HEMOGLOBIN: 8.9 g/dL — AB (ref 11.6–15.9)
Lymphocytes Relative: 26 %
Lymphs Abs: 1.5 10*3/uL (ref 0.9–3.3)
MCH: 39.7 pg — ABNORMAL HIGH (ref 25.1–34.0)
MCHC: 32.3 g/dL (ref 31.5–36.0)
MCV: 122.8 fL — ABNORMAL HIGH (ref 79.5–101.0)
MONOS PCT: 8 %
Monocytes Absolute: 0.5 10*3/uL (ref 0.1–0.9)
NEUTROS ABS: 3.7 10*3/uL (ref 1.5–6.5)
NEUTROS PCT: 61 %
Platelet Count: 328 10*3/uL (ref 145–400)
RBC: 2.25 MIL/uL — AB (ref 3.70–5.45)
RDW: 20.6 % — ABNORMAL HIGH (ref 11.2–14.5)
WBC: 6 10*3/uL (ref 3.9–10.3)

## 2017-11-18 NOTE — Progress Notes (Addendum)
[facility] OFFICE PROGRESS NOTE   Diagnosis: Megaloblastic anemia  INTERVAL HISTORY:   Dorothy Ritter returns as scheduled.  She received a second B12 injection 11/12/2017.  She reports being premedicated with prednisone and Benadryl.  She denies any signs of allergic reaction.  She overall is feeling much better.  She cites improvement in her energy level and shortness of breath has resolved.  Objective:  Vital signs in last 24 hours:  Blood pressure 140/78, pulse 82, temperature 98.7 F (37.1 C), temperature source Oral, resp. rate 18, height  (1.651 m), weight 188 lb 8 oz (85.5 kg), SpO2 98 %.    Resp: Lungs clear bilaterally. Cardio: Regular rate and rhythm. GI: Abdomen soft and nontender. Vascular: No leg edema. Neuro: Alert and oriented. Skin: No rash.   Lab Results:  Lab Results  Component Value Date   WBC 6.0 11/18/2017   HGB 8.9 (L) 11/18/2017   HCT 27.6 (L) 11/18/2017   MCV 122.8 (H) 11/18/2017   PLT 328 11/18/2017   NEUTROABS 3.7 11/18/2017    Imaging:  No results found.  Medications: I have reviewed the patient's current medications.  Assessment/Plan: 1. History of pernicious anemia treated in the past with vitamin B12 injections.  She did not receive injections for several years and recently presented with significant pancytopenia.  She received a B12 injection 10/28/2017 with improvement in the blood count.  She had an allergic reaction the day after the injection, not clear if the reaction was related to the vitamin B12 injection or other medications used concurrently.  She improved with prednisone and Benadryl.  She received a second B12 injection 11/12/2017.  CBC today further improved.  Disposition: [name] appears stable.  She has received 2 B12 injections.  She had no signs of allergic reaction following the second injection (she was premedicated with prednisone and Benadryl).  CBC from today shows further improvement in the  hemoglobin.  She will continue B12 injections through her PCPs office.  She was recently seen by gastroenterology.  Dr. Arbutus Ped recommends further GI evaluation if the hemoglobin does not correct into normal range after 1 to 2 months of B12 therapy.  We did not schedule formal follow-up in our office but will be happy to see her in the future if needed.  Patient seen with Dr. Arbutus Ped.   Lonna Cobb ANP/GNP-BC   11/18/2017  1:17 PM   ADDENDUM: Hematology/Oncology Attending: I had a face-to-face encounter with the patient today.  I recommended her care plan.  This is a very pleasant 72 years old African-American female with pernicious anemia and has been off treatment with vitamin B12 injection for several months resulting severe anemia and vitamin B12 deficiency.  The patient was a started again on vitamin B12 injection by her primary care physician but has allergic reaction.  We challenged with the patient with vitamin B12 injection again but with premedication of prednisone and Benadryl and the patient tolerated the treatment well.  I recommended for her to continue with the weekly injection for 2 more weeks followed by monthly injection under the care of her primary care physician.  If the patient develop any other allergic reaction to the vitamin B12 injection, she may be tried on high-dose oral vitamin B12. We will see the patient on an as-needed basis at this point.  She was also advised to call if she has any concerning symptoms. Disclaimer: This note was dictated with voice recognition software. Similar sounding words can inadvertently be  transcribed and may be missed upon review. Lajuana Matte, MD 11/20/17

## 2017-11-18 NOTE — Telephone Encounter (Signed)
No 5/16 los.  

## 2017-11-19 ENCOUNTER — Ambulatory Visit (INDEPENDENT_AMBULATORY_CARE_PROVIDER_SITE_OTHER): Payer: Medicare Other | Admitting: Emergency Medicine

## 2017-11-19 ENCOUNTER — Telehealth: Payer: Self-pay

## 2017-11-19 DIAGNOSIS — D518 Other vitamin B12 deficiency anemias: Secondary | ICD-10-CM | POA: Diagnosis not present

## 2017-11-19 MED ORDER — CYANOCOBALAMIN 1000 MCG/ML IJ SOLN
1000.0000 ug | Freq: Once | INTRAMUSCULAR | Status: AC
Start: 2017-11-19 — End: 2017-11-19
  Administered 2017-11-19: 1000 ug via INTRAMUSCULAR

## 2017-11-19 NOTE — Telephone Encounter (Signed)
Per ashleigh,NP and [name],NP---patient needs to get 4 weekly b12 injections (already recd one last week)---after 4th injection, she needs to go for b12 lab and CBC---she is also to take prednisone and benadryl before each injection on the day of the injection---

## 2017-11-26 ENCOUNTER — Other Ambulatory Visit: Payer: Self-pay

## 2017-11-26 ENCOUNTER — Ambulatory Visit (INDEPENDENT_AMBULATORY_CARE_PROVIDER_SITE_OTHER): Payer: Medicare Other | Admitting: *Deleted

## 2017-11-26 DIAGNOSIS — D518 Other vitamin B12 deficiency anemias: Secondary | ICD-10-CM

## 2017-11-26 DIAGNOSIS — E538 Deficiency of other specified B group vitamins: Secondary | ICD-10-CM

## 2017-11-26 MED ORDER — CYANOCOBALAMIN 1000 MCG/ML IJ SOLN
1000.0000 ug | Freq: Once | INTRAMUSCULAR | Status: AC
  Administered 2017-11-26: 1000 ug via INTRAMUSCULAR

## 2017-12-03 ENCOUNTER — Other Ambulatory Visit (INDEPENDENT_AMBULATORY_CARE_PROVIDER_SITE_OTHER): Payer: Medicare Other

## 2017-12-03 DIAGNOSIS — D518 Other vitamin B12 deficiency anemias: Secondary | ICD-10-CM

## 2017-12-03 LAB — CBC
HCT: 31.7 % — ABNORMAL LOW (ref 36.0–46.0)
Hemoglobin: 10.4 g/dL — ABNORMAL LOW (ref 12.0–15.0)
MCHC: 32.6 g/dL (ref 30.0–36.0)
PLATELETS: 372 10*3/uL (ref 150.0–400.0)
RBC: 2.78 Mil/uL — ABNORMAL LOW (ref 3.87–5.11)
RDW: 18.1 % — AB (ref 11.5–15.5)
WBC: 5.6 10*3/uL (ref 4.0–10.5)

## 2017-12-03 LAB — VITAMIN B12: Vitamin B-12: 560 pg/mL (ref 211–911)

## 2017-12-03 NOTE — Progress Notes (Signed)
I agree with administration.

## 2017-12-08 ENCOUNTER — Encounter: Payer: Self-pay | Admitting: Nurse Practitioner

## 2017-12-17 ENCOUNTER — Ambulatory Visit (INDEPENDENT_AMBULATORY_CARE_PROVIDER_SITE_OTHER): Payer: Medicare Other

## 2017-12-17 DIAGNOSIS — E538 Deficiency of other specified B group vitamins: Secondary | ICD-10-CM | POA: Diagnosis not present

## 2017-12-17 MED ORDER — CYANOCOBALAMIN 1000 MCG/ML IJ SOLN
1000.0000 ug | Freq: Once | INTRAMUSCULAR | Status: AC
  Administered 2017-12-17: 1000 ug via INTRAMUSCULAR

## 2017-12-17 NOTE — Progress Notes (Signed)
Medical screening examination/treatment/procedure(s) were performed by non-physician practitioner and as supervising physician I was immediately available for consultation/collaboration. I agree with above. James John, MD   

## 2017-12-28 ENCOUNTER — Encounter: Payer: Medicare Other | Admitting: Nurse Practitioner

## 2018-01-17 ENCOUNTER — Ambulatory Visit (INDEPENDENT_AMBULATORY_CARE_PROVIDER_SITE_OTHER): Payer: Medicare Other

## 2018-01-17 DIAGNOSIS — E538 Deficiency of other specified B group vitamins: Secondary | ICD-10-CM | POA: Diagnosis not present

## 2018-01-17 MED ORDER — CYANOCOBALAMIN 1000 MCG/ML IJ SOLN
1000.0000 ug | Freq: Once | INTRAMUSCULAR | Status: AC
  Administered 2018-01-17: 1000 ug via INTRAMUSCULAR

## 2018-01-17 NOTE — Progress Notes (Signed)
I have reviewed administration record and agree with treatment.  

## 2018-02-17 ENCOUNTER — Ambulatory Visit: Payer: Medicare Other

## 2018-02-22 ENCOUNTER — Telehealth: Payer: Self-pay | Admitting: *Deleted

## 2018-02-22 NOTE — Telephone Encounter (Signed)
She can wait to have her B12 injection at her 8/28 OV. We will need to make sure to give her the injection after she sees me and has labs drawn though, if she gets the injection before lab work we wont get an accurate  B12 reading.

## 2018-02-22 NOTE — Telephone Encounter (Signed)
[name] is out of the office today. Pt informed of this and advised her she can schedule a n/v for b12 injection or she can wait until 03/02/18 OV. She wants to know what [name] advises.    Copied from CRM [id number]. Topic: General - Other >> Feb 22, 2018  9:02 AM Arlyss Gandyichardson, Taren N, NT wrote: Reason for CRM: Pt states that when she had her last B12 injection it was mentioned that she could have the injection at the time of her physical on 8/28. Pt assumed the 8/15 appointment was cancelled, but it was not. Pt is wanting to make sure the doctor was ok with her to have the injection on 8/28 or if she was suppose to come in because that would have been to far in between? If she needs to come in she states she can tomorrow. Please advise.

## 2018-02-23 NOTE — Telephone Encounter (Signed)
Left detailed message informing pt of below.  

## 2018-02-23 NOTE — Telephone Encounter (Signed)
Pt returned call, advised pt of PCP advise below. Pt expressed a clear understanding.

## 2018-03-02 ENCOUNTER — Encounter: Payer: Self-pay | Admitting: Nurse Practitioner

## 2018-03-02 ENCOUNTER — Ambulatory Visit (INDEPENDENT_AMBULATORY_CARE_PROVIDER_SITE_OTHER): Payer: Medicare Other | Admitting: Nurse Practitioner

## 2018-03-02 ENCOUNTER — Other Ambulatory Visit (INDEPENDENT_AMBULATORY_CARE_PROVIDER_SITE_OTHER): Payer: Medicare Other

## 2018-03-02 VITALS — BP 140/82 | HR 59 | Ht 65.0 in | Wt 207.0 lb

## 2018-03-02 DIAGNOSIS — I10 Essential (primary) hypertension: Secondary | ICD-10-CM | POA: Diagnosis not present

## 2018-03-02 DIAGNOSIS — E538 Deficiency of other specified B group vitamins: Secondary | ICD-10-CM

## 2018-03-02 DIAGNOSIS — R609 Edema, unspecified: Secondary | ICD-10-CM

## 2018-03-02 DIAGNOSIS — Z23 Encounter for immunization: Secondary | ICD-10-CM | POA: Diagnosis not present

## 2018-03-02 DIAGNOSIS — E039 Hypothyroidism, unspecified: Secondary | ICD-10-CM

## 2018-03-02 DIAGNOSIS — Z9189 Other specified personal risk factors, not elsewhere classified: Secondary | ICD-10-CM | POA: Diagnosis not present

## 2018-03-02 DIAGNOSIS — Z0000 Encounter for general adult medical examination without abnormal findings: Secondary | ICD-10-CM

## 2018-03-02 DIAGNOSIS — Z1159 Encounter for screening for other viral diseases: Secondary | ICD-10-CM | POA: Diagnosis not present

## 2018-03-02 DIAGNOSIS — E785 Hyperlipidemia, unspecified: Secondary | ICD-10-CM

## 2018-03-02 LAB — COMPREHENSIVE METABOLIC PANEL
ALBUMIN: 4 g/dL (ref 3.5–5.2)
ALT: 10 U/L (ref 0–35)
AST: 13 U/L (ref 0–37)
Alkaline Phosphatase: 70 U/L (ref 39–117)
BUN: 18 mg/dL (ref 6–23)
CHLORIDE: 104 meq/L (ref 96–112)
CO2: 27 mEq/L (ref 19–32)
CREATININE: 0.72 mg/dL (ref 0.40–1.20)
Calcium: 9 mg/dL (ref 8.4–10.5)
GFR: 102.05 mL/min (ref >60.00)
GLUCOSE: 102 mg/dL — AB (ref 70–99)
Potassium: 3.9 mEq/L (ref 3.5–5.1)
Sodium: 139 mEq/L (ref 135–145)
Total Bilirubin: 0.5 mg/dL (ref 0.2–1.2)
Total Protein: 7.7 g/dL (ref 6.0–8.3)

## 2018-03-02 LAB — LIPID PANEL
CHOLESTEROL: 179 mg/dL (ref 0–200)
HDL: 77.1 mg/dL (ref >39.00)
LDL Cholesterol: 92 mg/dL (ref 0–99)
NONHDL: 101.65
Total CHOL/HDL Ratio: 2
Triglycerides: 48 mg/dL (ref 0.0–149.0)
VLDL: 9.6 mg/dL (ref 0.0–40.0)

## 2018-03-02 LAB — CBC
HEMATOCRIT: 37.3 % (ref 36.0–46.0)
Hemoglobin: 12.4 g/dL (ref 12.0–15.0)
MCHC: 33.2 g/dL (ref 30.0–36.0)
MCV: 95.2 fl (ref 78.0–100.0)
Platelets: 313 10*3/uL (ref 150.0–400.0)
RBC: 3.92 Mil/uL (ref 3.87–5.11)
RDW: 14.6 % (ref 11.5–15.5)
WBC: 5.2 10*3/uL (ref 4.0–10.5)

## 2018-03-02 LAB — TSH: TSH: 4.45 u[IU]/mL (ref 0.35–4.50)

## 2018-03-02 LAB — VITAMIN B12: VITAMIN B 12: 185 pg/mL — AB (ref 211–911)

## 2018-03-02 MED ORDER — POTASSIUM CHLORIDE ER 8 MEQ PO TBCR: 8.0000 meq | EXTENDED_RELEASE_TABLET | Freq: Every day | ORAL | 1 refills | Status: DC

## 2018-03-02 MED ORDER — CYANOCOBALAMIN 1000 MCG/ML IJ SOLN
1000.0000 ug | Freq: Once | INTRAMUSCULAR | Status: AC
  Administered 2018-03-02: 1000 ug via INTRAMUSCULAR

## 2018-03-02 NOTE — Assessment & Plan Note (Signed)
Stable Continue current dosage of lasix F/U for new, worsening symptoms - potassium chloride (KLOR-CON) 8 MEQ tablet; Take 1 tablet (8 mEq total) by mouth daily.  Dispense: 90 tablet; Refill: 1

## 2018-03-02 NOTE — Patient Instructions (Signed)
Please head downstairs for lab work. If any of your test results are critically abnormal, you will be contacted right away. Otherwise, I will contact you within a week about your test results and follow up recommendations  I will plan to see you back in 6 months for routine follow up, or sooner if needed.  Health Maintenance, Female Adopting a healthy lifestyle and getting preventive care can go a long way to promote health and wellness. Talk with your health care provider about what schedule of regular examinations is right for you. This is a good chance for you to check in with your provider about disease prevention and staying healthy. In between checkups, there are plenty of things you can do on your own. Experts have done a lot of research about which lifestyle changes and preventive measures are most likely to keep you healthy. Ask your health care provider for more information. Weight and diet Eat a healthy diet  Be sure to include plenty of vegetables, fruits, low-fat dairy products, and lean protein.  Do not eat a lot of foods high in solid fats, added sugars, or salt.  Get regular exercise. This is one of the most important things you can do for your health. ? Most adults should exercise for at least 150 minutes each week. The exercise should increase your heart rate and make you sweat (moderate-intensity exercise). ? Most adults should also do strengthening exercises at least twice a week. This is in addition to the moderate-intensity exercise.  Maintain a healthy weight  Body mass index (BMI) is a measurement that can be used to identify possible weight problems. It estimates body fat based on height and weight. Your health care provider can help determine your BMI and help you achieve or maintain a healthy weight.  For females 73 years of age and older: ? A BMI below 18.5 is considered underweight. ? A BMI of 18.5 to 24.9 is normal. ? A BMI of 25 to 29.9 is considered  overweight. ? A BMI of 30 and above is considered obese.  Watch levels of cholesterol and blood lipids  You should start having your blood tested for lipids and cholesterol at 73 years of age, then have this test every 5 years.  You may need to have your cholesterol levels checked more often if: ? Your lipid or cholesterol levels are high. ? You are older than 73 years of age. ? You are at high risk for heart disease.  Cancer screening Lung Cancer  Lung cancer screening is recommended for adults 59-42 years old who are at high risk for lung cancer because of a history of smoking.  A yearly low-dose CT scan of the lungs is recommended for people who: ? Currently smoke. ? Have quit within the past 15 years. ? Have at least a 30-pack-year history of smoking. A pack year is smoking an average of one pack of cigarettes a day for 1 year.  Yearly screening should continue until it has been 15 years since you quit.  Yearly screening should stop if you develop a health problem that would prevent you from having lung cancer treatment.  Breast Cancer  Practice breast self-awareness. This means understanding how your breasts normally appear and feel.  It also means doing regular breast self-exams. Let your health care provider know about any changes, no matter how small.  If you are in your 20s or 30s, you should have a clinical breast exam (CBE) by a health care provider  every 1-3 years as part of a regular health exam.  If you are 41 or older, have a CBE every year. Also consider having a breast X-ray (mammogram) every year.  If you have a family history of breast cancer, talk to your health care provider about genetic screening.  If you are at high risk for breast cancer, talk to your health care provider about having an MRI and a mammogram every year.  Breast cancer gene (BRCA) assessment is recommended for women who have family members with BRCA-related cancers. BRCA-related cancers  include: ? Breast. ? Ovarian. ? Tubal. ? Peritoneal cancers.  Results of the assessment will determine the need for genetic counseling and BRCA1 and BRCA2 testing.  Cervical Cancer Your health care provider may recommend that you be screened regularly for cancer of the pelvic organs (ovaries, uterus, and vagina). This screening involves a pelvic examination, including checking for microscopic changes to the surface of your cervix (Pap test). You may be encouraged to have this screening done every 3 years, beginning at age 65.  For women ages 44-65, health care providers may recommend pelvic exams and Pap testing every 3 years, or they may recommend the Pap and pelvic exam, combined with testing for human papilloma virus (HPV), every 5 years. Some types of HPV increase your risk of cervical cancer. Testing for HPV may also be done on women of any age with unclear Pap test results.  Other health care providers may not recommend any screening for nonpregnant women who are considered low risk for pelvic cancer and who do not have symptoms. Ask your health care provider if a screening pelvic exam is right for you.  If you have had past treatment for cervical cancer or a condition that could lead to cancer, you need Pap tests and screening for cancer for at least 20 years after your treatment. If Pap tests have been discontinued, your risk factors (such as having a new sexual partner) need to be reassessed to determine if screening should resume. Some women have medical problems that increase the chance of getting cervical cancer. In these cases, your health care provider may recommend more frequent screening and Pap tests.  Colorectal Cancer  This type of cancer can be detected and often prevented.  Routine colorectal cancer screening usually begins at 73 years of age and continues through 73 years of age.  Your health care provider may recommend screening at an earlier age if you have risk factors  for colon cancer.  Your health care provider may also recommend using home test kits to check for hidden blood in the stool.  A small camera at the end of a tube can be used to examine your colon directly (sigmoidoscopy or colonoscopy). This is done to check for the earliest forms of colorectal cancer.  Routine screening usually begins at age 19.  Direct examination of the colon should be repeated every 5-10 years through 73 years of age. However, you may need to be screened more often if early forms of precancerous polyps or small growths are found.  Skin Cancer  Check your skin from head to toe regularly.  Tell your health care provider about any new moles or changes in moles, especially if there is a change in a mole's shape or color.  Also tell your health care provider if you have a mole that is larger than the size of a pencil eraser.  Always use sunscreen. Apply sunscreen liberally and repeatedly throughout the day.  Protect yourself by wearing long sleeves, pants, a wide-brimmed hat, and sunglasses whenever you are outside.  Heart disease, diabetes, and high blood pressure  High blood pressure causes heart disease and increases the risk of stroke. High blood pressure is more likely to develop in: ? People who have blood pressure in the high end of the normal range (130-139/85-89 mm Hg). ? People who are overweight or obese. ? People who are African American.  If you are 57-39 years of age, have your blood pressure checked every 3-5 years. If you are 19 years of age or older, have your blood pressure checked every year. You should have your blood pressure measured twice-once when you are at a hospital or clinic, and once when you are not at a hospital or clinic. Record the average of the two measurements. To check your blood pressure when you are not at a hospital or clinic, you can use: ? An automated blood pressure machine at a pharmacy. ? A home blood pressure monitor.  If  you are between 19 years and 38 years old, ask your health care provider if you should take aspirin to prevent strokes.  Have regular diabetes screenings. This involves taking a blood sample to check your fasting blood sugar level. ? If you are at a normal weight and have a low risk for diabetes, have this test once every three years after 73 years of age. ? If you are overweight and have a high risk for diabetes, consider being tested at a younger age or more often. Preventing infection Hepatitis B  If you have a higher risk for hepatitis B, you should be screened for this virus. You are considered at high risk for hepatitis B if: ? You were born in a country where hepatitis B is common. Ask your health care provider which countries are considered high risk. ? Your parents were born in a high-risk country, and you have not been immunized against hepatitis B (hepatitis B vaccine). ? You have HIV or AIDS. ? You use needles to inject street drugs. ? You live with someone who has hepatitis B. ? You have had sex with someone who has hepatitis B. ? You get hemodialysis treatment. ? You take certain medicines for conditions, including cancer, organ transplantation, and autoimmune conditions.  Hepatitis C  Blood testing is recommended for: ? Everyone born from [birth year] through 1966. ? Anyone with known risk factors for hepatitis C.  Sexually transmitted infections (STIs)  You should be screened for sexually transmitted infections (STIs) including gonorrhea and chlamydia if: ? You are sexually active and are younger than 73 years of age. ? You are older than 73 years of age and your health care provider tells you that you are at risk for this type of infection. ? Your sexual activity has changed since you were last screened and you are at an increased risk for chlamydia or gonorrhea. Ask your health care provider if you are at risk.  If you do not have HIV, but are at risk, it may be recommended  that you take a prescription medicine daily to prevent HIV infection. This is called pre-exposure prophylaxis (PrEP). You are considered at risk if: ? You are sexually active and do not regularly use condoms or know the HIV status of your partner(s). ? You take drugs by injection. ? You are sexually active with a partner who has HIV.  Talk with your health care provider about whether you are at high risk of  being infected with HIV. If you choose to begin PrEP, you should first be tested for HIV. You should then be tested every 3 months for as long as you are taking PrEP. Pregnancy  If you are premenopausal and you may become pregnant, ask your health care provider about preconception counseling.  If you may become pregnant, take 400 to 800 micrograms (mcg) of folic acid every day.  If you want to prevent pregnancy, talk to your health care provider about birth control (contraception). Osteoporosis and menopause  Osteoporosis is a disease in which the bones lose minerals and strength with aging. This can result in serious bone fractures. Your risk for osteoporosis can be identified using a bone density scan.  If you are 63 years of age or older, or if you are at risk for osteoporosis and fractures, ask your health care provider if you should be screened.  Ask your health care provider whether you should take a calcium or vitamin D supplement to lower your risk for osteoporosis.  Menopause may have certain physical symptoms and risks.  Hormone replacement therapy may reduce some of these symptoms and risks. Talk to your health care provider about whether hormone replacement therapy is right for you. Follow these instructions at home:  Schedule regular health, dental, and eye exams.  Stay current with your immunizations.  Do not use any tobacco products including cigarettes, chewing tobacco, or electronic cigarettes.  If you are pregnant, do not drink alcohol.  If you are  breastfeeding, limit how much and how often you drink alcohol.  Limit alcohol intake to no more than 1 drink per day for nonpregnant women. One drink equals 12 ounces of beer, 5 ounces of wine, or 1 ounces of hard liquor.  Do not use street drugs.  Do not share needles.  Ask your health care provider for help if you need support or information about quitting drugs.  Tell your health care provider if you often feel depressed.  Tell your health care provider if you have ever been abused or do not feel safe at home. This information is not intended to replace advice given to you by your health care provider. Make sure you discuss any questions you have with your health care provider. Document Released: 01/05/2011 Document Revised: 11/28/2015 Document Reviewed: 03/26/2015 Elsevier Interactive Patient Education  [employer].

## 2018-03-02 NOTE — Assessment & Plan Note (Signed)
Continue levothyroxine at current dosage Update labs - TSH; Future

## 2018-03-02 NOTE — Assessment & Plan Note (Signed)
Update labs F/U with further recommendations pending lab results - CBC; Future - Vitamin B12; Future

## 2018-03-02 NOTE — Assessment & Plan Note (Signed)
-  USPSTF grade A and B recommendations reviewed with patient; age-appropriate recommendations, preventive care, screening tests, etc discussed and encouraged; healthy living encouraged; see AVS for patient education given to patient. Advanced directives packet provided to patient. -Discussed importance of healthy diet and regular physical activity and drink plenty of water and avoid sweet beverages.  -Follow up and care instructions discussed and provided in AVS.  -Reviewed Health Maintenance:  we will send for records from [facility] to update HM including DEXA, MM in patient chart Need for 23-polyvalent pneumococcal polysaccharide vaccine- Pneumococcal polysaccharide vaccine 23-valent greater than or equal to 2yo subcutaneous/IM Encounter for hepatitis C virus screening test for high risk patient- Hepatitis C antibody; Future  Routine general medical examination at a health care facility - Lipid panel; Future - CBC; Future - Comprehensive metabolic panel; Future - Hepatitis C antibody; Future

## 2018-03-02 NOTE — Assessment & Plan Note (Signed)
Stable Continue current medications Update labs - potassium chloride (KLOR-CON) 8 MEQ tablet; Take 1 tablet (8 mEq total) by mouth daily.  Dispense: 90 tablet; Refill: 1 - TSH; Future - Lipid panel; Future - CBC; Future - Comprehensive metabolic panel; Future

## 2018-03-02 NOTE — Assessment & Plan Note (Signed)
Continue pravastatin Update labs She is fasting - Lipid panel; Future

## 2018-03-02 NOTE — Progress Notes (Signed)
Name: Dorothy Ritter   MRN: [id number]    DOB: 1946/06/07   Date:03/02/2018       Progress Note  Subjective  Chief Complaint CPE, follow up  HPI  Patient presents for annual CPE. We will also check her B12 levels today for history of B12 deficiency, currently maintained on monthly b12 injections and her thyroid level for monitoring of hypothyroidism, maintained on levothyroxine 75.  Diet, Exercise: no routine diet or exericse  USPSTF grade A and B recommendations  Depression: no concerns Depression screen [facility] 2/9 03/02/2018 12/23/2015  Decreased Interest 0 0  Down, Depressed, Hopeless 0 0  PHQ - 2 Score 0 0   Hypertension: maintained on amlodipine 5, quinapril 60 daily, lasix 20 BID (for history of peripheral edema as well) Reports daily medication compliance without noted adverse medication effects Also maintained on potassium 8 meq daily for hypokalemia in the past BP Readings from Last 3 Encounters:  03/02/18 140/82  11/18/17 140/78  11/16/17 120/70   Obesity: Wt Readings from Last 3 Encounters:  03/02/18 207 lb (93.9 kg)  11/18/17 188 lb 8 oz (85.5 kg)  11/16/17 188 lb (85.3 kg)   BMI Readings from Last 3 Encounters:  03/02/18 34.45 kg/m  11/18/17 31.37 kg/m  11/16/17 31.28 kg/m    Alcohol: no Tobacco use: no, never  Hep C: hep C screening ordered today  Menstrual History/LMP/Abnormal Bleeding: post menopausal, follows with GYN for routine womens health care needs Incontinence Symptoms: denies  Vaccinations: PNA vaccination today  Advanced Care Planning: A voluntary discussion about advance care planning including the explanation and discussion of advance directives.  Discussed health care proxy and Living will, and the patient DOES NOT have a living will at present time. If patient does have living will, I have requested they bring this to the clinic to be scanned in to their chart.  Breast cancer: reports mammogram up to date, ordered by [facility], her provider for routine womens health care needs  Osteoporosis: DEXA ordered by GYN provider per patient  Lipids: maintained on pravastatin 40 daily, tolerating well Lab Results  Component Value Date   CHOL 172 12/22/2016   CHOL 214 (H) 01/25/2015   CHOL 177 10/05/2012   Lab Results  Component Value Date   HDL 57.80 12/22/2016   HDL 64 01/25/2015   HDL 49.50 10/05/2012   Lab Results  Component Value Date   LDLCALC 101 (H) 12/22/2016   LDLCALC 134 (H) 01/25/2015   LDLCALC 111 (H) 10/05/2012   Lab Results  Component Value Date   TRIG 65.0 12/22/2016   TRIG 78 01/25/2015   TRIG 84.0 10/05/2012   Lab Results  Component Value Date   CHOLHDL 3 12/22/2016   CHOLHDL 4 10/05/2012   CHOLHDL 3 01/25/2012   Lab Results  Component Value Date   LDLDIRECT 126.8 01/25/2012   LDLDIRECT 132.3 02/12/2011   LDLDIRECT 186.0 11/22/2009    Glucose:  Glucose, Bld  Date Value Ref Range Status  11/04/2017 95 70 - 140 mg/dL Final  40/98/1191 478 (H) 65 - 99 mg/dL Final  29/56/2130 90 65 - 99 mg/dL Final    Skin cancer: does not routinely wear sunscreen  Colorectal cancer: No personal or family history of colon ca, no abdominal pain, no bowel changes, no rectal bleeding. cologuard up to date.   Aspirin: 81 daily ECG: not indicated  Patient Active Problem List   Diagnosis Date Noted  . Edema 10/28/2017  . Obesity 12/23/2015  . Hyperglycemia  01/25/2012  . Hyperlipidemia 11/22/2009  . ELECTROCARDIOGRAM, ABNORMAL 11/22/2009  . Lumbar radicular pain 09/04/2008  . Pernicious anemia 11/17/2007  . Hypothyroidism 10/20/2006  . Essential hypertension 10/20/2006    Past Surgical History:  Procedure Laterality Date  . ABDOMINAL HYSTERECTOMY     Dr. Rana Snare, dysfunctional menses  . COLONOSCOPY  2001   lymphoid nodules  . FRACTURE SURGERY Right 1970's   Ankle Fx.  . THYROIDECTOMY     goiter, no malignancy    Family History  Problem Relation Age of Onset  . Heart disease  Mother        pacemaker  . Prostate cancer Father   . Cancer Father   . Cancer Sister        oral  . Osteoarthritis Sister   . Diabetes Neg Hx   . Depression Neg Hx   . Stroke Neg Hx     Social History   Socioeconomic History  . Marital status: Married    Spouse name: Not on file  . Number of children: 3  . Years of education: 56  . Highest education level: Not on file  Occupational History  . Occupation: Retired  Engineer, production  . Financial resource strain: Not on file  . Food insecurity:    Worry: Not on file    Inability: Not on file  . Transportation needs:    Medical: Not on file    Non-medical: Not on file  Tobacco Use  . Smoking status: Never Smoker  . Smokeless tobacco: Never Used  Substance and Sexual Activity  . Alcohol use: No  . Drug use: No  . Sexual activity: Not on file  Lifestyle  . Physical activity:    Days per week: Not on file    Minutes per session: Not on file  . Stress: Not on file  Relationships  . Social connections:    Talks on phone: Not on file    Gets together: Not on file    Attends religious service: Not on file    Active member of club or organization: Not on file    Attends meetings of clubs or organizations: Not on file    Relationship status: Not on file  . Intimate partner violence:    Fear of current or ex partner: Not on file    Emotionally abused: Not on file    Physically abused: Not on file    Forced sexual activity: Not on file  Other Topics Concern  . Not on file  Social History Narrative  . Not on file     Current Outpatient Medications:  .  albuterol (PROVENTIL HFA;VENTOLIN HFA) 108 (90 Base) MCG/ACT inhaler, Inhale 2 puffs into the lungs every 6 (six) hours as needed for wheezing or shortness of breath., Disp: 1 Inhaler, Rfl: 0 .  amLODipine (NORVASC) 5 MG tablet, TAKE 1 TABLET BY MOUTH ONCE DAILY, Disp: 90 tablet, Rfl: 1 .  aspirin 81 MG tablet, Take 1 tablet (81 mg total) by mouth daily., Disp: 30 tablet,  Rfl: 1 .  diphenhydrAMINE (BENADRYL) 50 MG tablet, Take 1 tablet one hour prior to injection as directed;, Disp: 30 tablet, Rfl: 0 .  ferrous sulfate 325 (65 FE) MG tablet, Take 1 tablet (325 mg total) by mouth daily., Disp: 30 tablet, Rfl: 0 .  furosemide (LASIX) 20 MG tablet, Take 1 tablet (20 mg total) by mouth 2 (two) times daily. Must keep scheduled appt for Feb w/new provider for refills, Disp: 180 tablet, Rfl: 1 .  levothyroxine (SYNTHROID, LEVOTHROID) 75 MCG tablet, TAKE 1 TABLET BY MOUTH DAILY EXCEPT TAKE 1 1/2 TABLETS ON TUES THURS AND SATURDAY, Disp: 306 tablet, Rfl: 1 .  potassium chloride (KLOR-CON) 8 MEQ tablet, Take 1 tablet (8 mEq total) by mouth daily., Disp: 90 tablet, Rfl: 1 .  pravastatin (PRAVACHOL) 40 MG tablet, Take 1 tablet (40 mg total) by mouth daily. Must keep scheduled appt w/new provider for future refills, Disp: 90 tablet, Rfl: 1 .  predniSONE (DELTASONE) 50 MG tablet, Take 1 tablet one hour prior to injection as directed;, Disp: 10 tablet, Rfl: 0 .  quinapril (ACCUPRIL) 40 MG tablet, Take 1.5 tablets (60 mg total) by mouth daily., Disp: 135 tablet, Rfl: 1 .  traMADol (ULTRAM) 50 MG tablet, take 1 tablet by mouth every 8 hours if needed for pain (Patient taking differently: Take 50 mg by mouth. take 1 tablet by mouth every 8 hours if needed for pain), Disp: 30 tablet, Rfl: 1  No Known Allergies   ROS  Constitutional: Negative for fever or weight change.  Respiratory: Negative for cough and shortness of breath.   Cardiovascular: Negative for chest pain or palpitations.  Gastrointestinal: Negative for abdominal pain, no bowel changes.  Musculoskeletal: Negative for gait problem or joint swelling.  Skin: Negative for rash.  Neurological: Negative for dizziness or headache.  No other specific complaints in a complete review of systems (except as listed in HPI above).   Objective  Vitals:   03/02/18 0853  BP: 140/82  Pulse: (!) 59  SpO2: 97%  Weight: 207 lb  (93.9 kg)  Height: 5\' 5"  (1.651 m)    Body mass index is 34.45 kg/m.  Physical Exam Vital signs reviewed. Constitutional: Patient appears well-developed and well-nourished. No distress.  HENT: Head: Normocephalic and atraumatic. Ears: B TMs ok, no erythema or effusion; Nose: Nose normal. Mouth/Throat: Oropharynx is clear and moist. No oropharyngeal exudate.  Eyes: Conjunctivae and EOM are normal. Pupils are equal, round, and reactive to light. No scleral icterus.  Neck: Normal range of motion. Neck supple. No thyromegaly present. No cervical adenopathy. Cardiovascular: Normal rate, regular rhythm and normal heart sounds.  No murmur heard. No BLE edema. Distal pulses intact. Pulmonary/Chest: Effort normal and breath sounds normal. No respiratory distress. Abdominal: Soft, rotund. Bowel sounds are normal. There is no tenderness. no masses Breast: defd to GYN FEMALE GENITALIA:  Defd to GYN Musculoskeletal: Normal range of motion, No gross deformities Neurological: She is alert and oriented to person, place, and time. No cranial nerve deficit. Coordination, balance, strength, speech and gait are normal.  Skin: Skin is warm and dry. No rash noted. No erythema.  Psychiatric: Patient has a normal mood and affect. behavior is normal. Judgment and thought content normal.  Assessment & Plan RTC in 6 months for F/U: hypothyroidism- TSH

## 2018-03-03 LAB — HEPATITIS C ANTIBODY
HEP C AB: NONREACTIVE
SIGNAL TO CUT-OFF: 0.02 (ref <1.00)

## 2018-03-08 ENCOUNTER — Telehealth: Payer: Self-pay | Admitting: Medical Oncology

## 2018-03-08 NOTE — Telephone Encounter (Signed)
Message to [facility] re appt.

## 2018-03-09 ENCOUNTER — Telehealth: Payer: Self-pay | Admitting: *Deleted

## 2018-03-09 ENCOUNTER — Ambulatory Visit: Payer: Medicare Other

## 2018-03-09 NOTE — Telephone Encounter (Signed)
Copied from CRM [id number]. Topic: Quick Communication - See Telephone Encounter >> Mar 09, 2018  1:00 PM Herby Abraham C wrote: CRM for notification. See Telephone encounter for: 03/09/18.  Pt called in to make provider aware that she is having a time with getting her B-12 injection scheduled with Oncology. Pt says that she has called several times and have left vm's with her contact information however, pt is unable to reach who she needs to to schedule. Pt is due to follow back up with PCP in 6 weeks of receiving her shot.    Pt would like to know if someone could assist her with getting through to oncology. I have called on pt's behalf 4 times while we are on the line. Unable to reach anyone, left pt's information for a call back.

## 2018-03-10 ENCOUNTER — Other Ambulatory Visit: Payer: Self-pay | Admitting: Nurse Practitioner

## 2018-03-10 ENCOUNTER — Other Ambulatory Visit: Payer: Self-pay | Admitting: Medical Oncology

## 2018-03-10 ENCOUNTER — Telehealth: Payer: Self-pay | Admitting: Nurse Practitioner

## 2018-03-10 ENCOUNTER — Ambulatory Visit (INDEPENDENT_AMBULATORY_CARE_PROVIDER_SITE_OTHER): Payer: Medicare Other | Admitting: *Deleted

## 2018-03-10 DIAGNOSIS — Z23 Encounter for immunization: Secondary | ICD-10-CM | POA: Diagnosis not present

## 2018-03-10 DIAGNOSIS — D531 Other megaloblastic anemias, not elsewhere classified: Secondary | ICD-10-CM

## 2018-03-10 DIAGNOSIS — D518 Other vitamin B12 deficiency anemias: Secondary | ICD-10-CM

## 2018-03-10 DIAGNOSIS — E039 Hypothyroidism, unspecified: Secondary | ICD-10-CM

## 2018-03-10 NOTE — Telephone Encounter (Signed)
I have placed an order for a future TSH, to be drawn around the second week in October, since we increased her levothyroxine dosage on 8/28 lab note, can you please just make sure she knows to stop by and have this drawn?

## 2018-03-10 NOTE — Telephone Encounter (Signed)
I called pt- no answer/unable to leave vm. Will try again later.  

## 2018-03-11 ENCOUNTER — Telehealth: Payer: Self-pay | Admitting: Internal Medicine

## 2018-03-11 NOTE — Telephone Encounter (Signed)
Called regarding 10/15 °

## 2018-03-11 NOTE — Telephone Encounter (Signed)
Pt informed of below. I also advised pt to come back around 04/02/18 for nurse visit b12 injection and f/u with hematology per [name] for her b12 anemia.

## 2018-03-22 ENCOUNTER — Encounter: Payer: Self-pay | Admitting: Nurse Practitioner

## 2018-04-01 ENCOUNTER — Ambulatory Visit (INDEPENDENT_AMBULATORY_CARE_PROVIDER_SITE_OTHER): Payer: Medicare Other

## 2018-04-01 DIAGNOSIS — E538 Deficiency of other specified B group vitamins: Secondary | ICD-10-CM

## 2018-04-01 MED ORDER — CYANOCOBALAMIN 1000 MCG/ML IJ SOLN
1000.0000 ug | Freq: Once | INTRAMUSCULAR | Status: AC
  Administered 2018-04-01: 1000 ug via INTRAMUSCULAR

## 2018-04-01 NOTE — Progress Notes (Signed)
I have reviewed administration record and agree with treatment.  

## 2018-04-11 ENCOUNTER — Ambulatory Visit: Payer: Medicare Other | Admitting: Nurse Practitioner

## 2018-04-11 DIAGNOSIS — Z0289 Encounter for other administrative examinations: Secondary | ICD-10-CM

## 2018-04-14 ENCOUNTER — Ambulatory Visit: Payer: Medicare Other | Admitting: Nurse Practitioner

## 2018-04-18 ENCOUNTER — Other Ambulatory Visit: Payer: Self-pay | Admitting: Medical Oncology

## 2018-04-18 DIAGNOSIS — D518 Other vitamin B12 deficiency anemias: Secondary | ICD-10-CM

## 2018-04-19 ENCOUNTER — Inpatient Hospital Stay (HOSPITAL_BASED_OUTPATIENT_CLINIC_OR_DEPARTMENT_OTHER): Payer: Medicare Other | Admitting: Internal Medicine

## 2018-04-19 ENCOUNTER — Encounter: Payer: Self-pay | Admitting: Internal Medicine

## 2018-04-19 ENCOUNTER — Inpatient Hospital Stay: Payer: Medicare Other | Attending: Internal Medicine

## 2018-04-19 VITALS — BP 147/82 | HR 73 | Temp 98.3°F | Resp 17 | Ht 65.0 in | Wt 205.7 lb

## 2018-04-19 DIAGNOSIS — D510 Vitamin B12 deficiency anemia due to intrinsic factor deficiency: Secondary | ICD-10-CM | POA: Insufficient documentation

## 2018-04-19 DIAGNOSIS — Z79899 Other long term (current) drug therapy: Secondary | ICD-10-CM | POA: Diagnosis not present

## 2018-04-19 DIAGNOSIS — D518 Other vitamin B12 deficiency anemias: Secondary | ICD-10-CM

## 2018-04-19 LAB — CMP (CANCER CENTER ONLY)
ALK PHOS: 79 U/L (ref 38–126)
ALT: 12 U/L (ref 0–44)
ANION GAP: 8 (ref 5–15)
AST: 15 U/L (ref 15–41)
Albumin: 3.5 g/dL (ref 3.5–5.0)
BILIRUBIN TOTAL: 0.4 mg/dL (ref 0.3–1.2)
BUN: 15 mg/dL (ref 8–23)
CALCIUM: 8.9 mg/dL (ref 8.9–10.3)
CO2: 29 mmol/L (ref 22–32)
Chloride: 104 mmol/L (ref 98–111)
Creatinine: 0.76 mg/dL (ref 0.44–1.00)
GFR, Est AFR Am: >60 mL/min (ref >60)
GLUCOSE: 82 mg/dL (ref 70–99)
POTASSIUM: 3.9 mmol/L (ref 3.5–5.1)
Sodium: 141 mmol/L (ref 135–145)
TOTAL PROTEIN: 7.3 g/dL (ref 6.5–8.1)

## 2018-04-19 LAB — CBC WITH DIFFERENTIAL (CANCER CENTER ONLY)
Abs Immature Granulocytes: 0.01 10*3/uL (ref 0.00–0.07)
BASOS ABS: 0.1 10*3/uL (ref 0.0–0.1)
BASOS PCT: 2 %
EOS ABS: 0.2 10*3/uL (ref 0.0–0.5)
Eosinophils Relative: 3 %
HCT: 37.6 % (ref 36.0–46.0)
Hemoglobin: 12.2 g/dL (ref 12.0–15.0)
IMMATURE GRANULOCYTES: 0 %
Lymphocytes Relative: 45 %
Lymphs Abs: 2.7 10*3/uL (ref 0.7–4.0)
MCH: 30.9 pg (ref 26.0–34.0)
MCHC: 32.4 g/dL (ref 30.0–36.0)
MCV: 95.2 fL (ref 80.0–100.0)
Monocytes Absolute: 0.6 10*3/uL (ref 0.1–1.0)
Monocytes Relative: 10 %
NEUTROS ABS: 2.3 10*3/uL (ref 1.7–7.7)
NEUTROS PCT: 40 %
PLATELETS: 289 10*3/uL (ref 150–400)
RBC: 3.95 MIL/uL (ref 3.87–5.11)
RDW: 14.3 % (ref 11.5–15.5)
WBC Count: 5.9 10*3/uL (ref 4.0–10.5)
nRBC: 0 % (ref 0.0–0.2)

## 2018-04-19 LAB — VITAMIN B12: Vitamin B-12: 252 pg/mL (ref 180–914)
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
## 2018-04-20 ENCOUNTER — Encounter: Payer: Self-pay | Admitting: Nurse Practitioner

## 2018-04-20 ENCOUNTER — Ambulatory Visit (INDEPENDENT_AMBULATORY_CARE_PROVIDER_SITE_OTHER): Payer: Medicare Other | Admitting: Nurse Practitioner

## 2018-04-20 VITALS — BP 120/72 | HR 68 | Ht 65.0 in | Wt 206.0 lb

## 2018-04-20 DIAGNOSIS — E039 Hypothyroidism, unspecified: Secondary | ICD-10-CM | POA: Diagnosis not present

## 2018-04-20 DIAGNOSIS — E538 Deficiency of other specified B group vitamins: Secondary | ICD-10-CM | POA: Diagnosis not present

## 2018-04-20 MED ORDER — CYANOCOBALAMIN 1000 MCG/ML IJ SOLN
1000.0000 ug | Freq: Once | INTRAMUSCULAR | Status: AC
  Administered 2018-04-20: 1000 ug via INTRAMUSCULAR

## 2018-04-20 NOTE — Patient Instructions (Addendum)
Head downstairs for lab work today  Continue monthly B12 injections, ill see you back around February to recheck your b12 level. We need to do this BEFORE you receive a b12 injection for that month.  It was nice to see you today!

## 2018-04-20 NOTE — Assessment & Plan Note (Signed)
Continue monthly b12 injections RTC in about 4 months for F/U- repeat b12 level - cyanocobalamin ((VITAMIN B-12)) injection 1,000 mcg
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
## 2018-04-20 NOTE — Assessment & Plan Note (Signed)
Continue current medication Reck TSH today F/U with further recommendations pending lab results - TSH; Future

## 2018-04-25 ENCOUNTER — Other Ambulatory Visit: Payer: Self-pay | Admitting: *Deleted

## 2018-04-25 DIAGNOSIS — R609 Edema, unspecified: Secondary | ICD-10-CM

## 2018-04-25 DIAGNOSIS — I10 Essential (primary) hypertension: Secondary | ICD-10-CM

## 2018-04-25 DIAGNOSIS — E785 Hyperlipidemia, unspecified: Secondary | ICD-10-CM

## 2018-04-25 MED ORDER — PRAVASTATIN SODIUM 40 MG PO TABS: 40.0000 mg | ORAL_TABLET | Freq: Every day | ORAL | 1 refills | Status: DC

## 2018-04-25 MED ORDER — QUINAPRIL HCL 40 MG PO TABS: 60.0000 mg | ORAL_TABLET | Freq: Every day | ORAL | 1 refills | Status: DC

## 2018-04-25 MED ORDER — FUROSEMIDE 20 MG PO TABS: 20.0000 mg | ORAL_TABLET | Freq: Two times a day (BID) | ORAL | 1 refills | Status: DC

## 2018-04-25 MED ORDER — AMLODIPINE BESYLATE 5 MG PO TABS: ORAL_TABLET | ORAL | 1 refills | Status: DC

## 2018-05-03 ENCOUNTER — Ambulatory Visit: Payer: Medicare Other

## 2018-05-23 ENCOUNTER — Ambulatory Visit (INDEPENDENT_AMBULATORY_CARE_PROVIDER_SITE_OTHER): Payer: Medicare Other | Admitting: Nurse Practitioner

## 2018-05-23 ENCOUNTER — Ambulatory Visit: Payer: Medicare Other

## 2018-05-23 ENCOUNTER — Encounter: Payer: Self-pay | Admitting: Nurse Practitioner

## 2018-05-23 VITALS — BP 130/82 | HR 68 | Temp 98.0°F | Ht 65.0 in | Wt 213.0 lb

## 2018-05-23 DIAGNOSIS — J069 Acute upper respiratory infection, unspecified: Secondary | ICD-10-CM | POA: Diagnosis not present

## 2018-05-23 DIAGNOSIS — E538 Deficiency of other specified B group vitamins: Secondary | ICD-10-CM | POA: Diagnosis not present

## 2018-05-23 MED ORDER — GUAIFENESIN ER 600 MG PO TB12: 600.0000 mg | ORAL_TABLET | Freq: Two times a day (BID) | ORAL | 0 refills | Status: DC

## 2018-05-23 MED ORDER — FLUTICASONE PROPIONATE 50 MCG/ACT NA SUSP: 2.0000 | Freq: Every day | NASAL | 0 refills | Status: DC

## 2018-05-23 MED ORDER — CYANOCOBALAMIN 1000 MCG/ML IJ SOLN
1000.0000 ug | Freq: Once | INTRAMUSCULAR | Status: AC
  Administered 2018-05-23: 1000 ug via INTRAMUSCULAR

## 2018-05-23 MED ORDER — ALBUTEROL SULFATE HFA 108 (90 BASE) MCG/ACT IN AERS: 2.0000 | INHALATION_SPRAY | Freq: Four times a day (QID) | RESPIRATORY_TRACT | 0 refills | Status: DC | PRN

## 2018-05-23 NOTE — Assessment & Plan Note (Signed)
Labs are up to date - cyanocobalamin ((VITAMIN B-12)) injection 1,000 mcg 
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
## 2018-05-23 NOTE — Patient Instructions (Addendum)
Start albuterol as needed for cough, wheezing  Start mucinex as needed for cough  Start flonase- you may start with 2 sprays in each nostril daily then reduce to 1 spray in each nostril daily when your symptoms improve  Please follow up for fevers over 101, if your symptoms get worse, or if your symptoms dont get better in 1 week.   Upper Respiratory Infection, Adult Most upper respiratory infections (URIs) are caused by a virus. A URI affects the nose, throat, and upper air passages. The most common type of URI is often called "the common cold." Follow these instructions at home:  Take medicines only as told by your doctor.  Gargle warm saltwater or take cough drops to comfort your throat as told by your doctor.  Use a warm mist humidifier or inhale steam from a shower to increase air moisture. This may make it easier to breathe.  Drink enough fluid to keep your pee (urine) clear or pale yellow.  Eat soups and other clear broths.  Have a healthy diet.  Rest as needed.  Go back to work when your fever is gone or your doctor says it is okay. ? You may need to stay home longer to avoid giving your URI to others. ? You can also wear a face mask and wash your hands often to prevent spread of the virus.  Use your inhaler more if you have asthma.  Do not use any tobacco products, including cigarettes, chewing tobacco, or electronic cigarettes. If you need help quitting, ask your doctor. Contact a doctor if:  You are getting worse, not better.  Your symptoms are not helped by medicine.  You have chills.  You are getting more short of breath.  You have brown or red mucus.  You have yellow or brown discharge from your nose.  You have pain in your face, especially when you bend forward.  You have a fever.  You have puffy (swollen) neck glands.  You have pain while swallowing.  You have white areas in the back of your throat. Get help right away if:  You have very bad  or constant: ? Headache. ? Ear pain. ? Pain in your forehead, behind your eyes, and over your cheekbones (sinus pain). ? Chest pain.  You have long-lasting (chronic) lung disease and any of the following: ? Wheezing. ? Long-lasting cough. ? Coughing up blood. ? A change in your usual mucus.  You have a stiff neck.  You have changes in your: ? Vision. ? Hearing. ? Thinking. ? Mood. This information is not intended to replace advice given to you by your health care provider. Make sure you discuss any questions you have with your health care provider. Document Released: 12/09/2007 Document Revised: 02/23/2016 Document Reviewed: 09/27/2013 Elsevier Interactive Patient Education  2018 [employer]

## 2018-05-30 ENCOUNTER — Telehealth: Payer: Self-pay | Admitting: Nurse Practitioner

## 2018-05-30 MED ORDER — AZITHROMYCIN 250 MG PO TABS: ORAL_TABLET | ORAL | 0 refills | Status: DC

## 2018-05-30 NOTE — Telephone Encounter (Signed)
Copied from CRM [id number]. Topic: Quick Communication - See Telephone Encounter >> May 30, 2018  8:28 AM Trula SladeWalter, Linda F wrote: CRM for notification. See Telephone encounter for: 05/30/18. Patient was in to see the provider last Monday for a sore throat but she is getting worse, and she is spitting up dark yellow mucus.  She wants to know if something can be called into the pharmacy for her. She would like her preferred pharmacy [facility] on [address]

## 2018-05-30 NOTE — Telephone Encounter (Signed)
Pt informed of below.  

## 2018-05-30 NOTE — Telephone Encounter (Signed)
zithromax antibiotic course sent Please have her follow up for fevers over 101, if your symptoms get worse, or if your symptoms dont get better with the antibiotic.

## 2018-06-23 ENCOUNTER — Ambulatory Visit: Payer: Medicare Other

## 2018-06-23 ENCOUNTER — Ambulatory Visit (INDEPENDENT_AMBULATORY_CARE_PROVIDER_SITE_OTHER): Payer: Medicare Other | Admitting: Emergency Medicine

## 2018-06-23 DIAGNOSIS — E538 Deficiency of other specified B group vitamins: Secondary | ICD-10-CM | POA: Diagnosis not present

## 2018-06-23 MED ORDER — CYANOCOBALAMIN 1000 MCG/ML IJ SOLN
1000.0000 ug | Freq: Once | INTRAMUSCULAR | Status: AC
  Administered 2018-06-23: 1000 ug via INTRAMUSCULAR

## 2018-06-23 NOTE — Progress Notes (Signed)
I have reviewed administration record and agree with treatment.  

## 2018-07-25 ENCOUNTER — Ambulatory Visit (INDEPENDENT_AMBULATORY_CARE_PROVIDER_SITE_OTHER): Payer: Medicare Other

## 2018-07-25 DIAGNOSIS — E538 Deficiency of other specified B group vitamins: Secondary | ICD-10-CM

## 2018-07-25 MED ORDER — CYANOCOBALAMIN 1000 MCG/ML IJ SOLN
1000.0000 ug | Freq: Once | INTRAMUSCULAR | Status: AC
  Administered 2018-07-25: 1000 ug via INTRAMUSCULAR

## 2018-07-25 NOTE — Progress Notes (Signed)
I have reviewed administration record and agree with treatment.  

## 2018-08-24 ENCOUNTER — Ambulatory Visit (INDEPENDENT_AMBULATORY_CARE_PROVIDER_SITE_OTHER): Payer: Medicare Other | Admitting: Nurse Practitioner

## 2018-08-24 ENCOUNTER — Encounter: Payer: Self-pay | Admitting: Nurse Practitioner

## 2018-08-24 ENCOUNTER — Other Ambulatory Visit (INDEPENDENT_AMBULATORY_CARE_PROVIDER_SITE_OTHER): Payer: Medicare Other

## 2018-08-24 ENCOUNTER — Ambulatory Visit (INDEPENDENT_AMBULATORY_CARE_PROVIDER_SITE_OTHER): Payer: Medicare Other | Admitting: *Deleted

## 2018-08-24 VITALS — BP 128/80 | HR 69 | Ht 65.0 in | Wt 219.0 lb

## 2018-08-24 DIAGNOSIS — E039 Hypothyroidism, unspecified: Secondary | ICD-10-CM | POA: Diagnosis not present

## 2018-08-24 DIAGNOSIS — Z0000 Encounter for general adult medical examination without abnormal findings: Secondary | ICD-10-CM

## 2018-08-24 DIAGNOSIS — E538 Deficiency of other specified B group vitamins: Secondary | ICD-10-CM | POA: Diagnosis not present

## 2018-08-24 LAB — TSH: TSH: 2.39 u[IU]/mL (ref 0.35–4.50)

## 2018-08-24 MED ORDER — CYANOCOBALAMIN 1000 MCG/ML IJ SOLN
1000.0000 ug | Freq: Once | INTRAMUSCULAR | Status: AC
  Administered 2018-08-24: 1000 ug via INTRAMUSCULAR
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
## 2018-08-24 NOTE — Patient Instructions (Addendum)
Head downstairs for labs today  We need to recheck your B12 level BEFORE you get your b12 shot next month, you can come for a nurse visit next month for b12 shot, but please stop by the lab BEFORE your visit to have your B12 checked
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
## 2018-08-24 NOTE — Patient Instructions (Signed)
Continue doing brain stimulating activities (puzzles, reading, adult coloring books, staying active) to keep memory sharp.   Continue to eat heart healthy diet (full of fruits, vegetables, whole grains, lean protein, water--limit salt, fat, and sugar intake) and increase physical activity as tolerated.   Dorothy Ritter , Thank you for taking time to come for your Medicare Wellness Visit. I appreciate your ongoing commitment to your health goals. Please review the following plan we discussed and let me know if I can assist you in the future.   These are the goals we discussed: Goals    . Patient Stated     Stay as healthy and as independent as possible.        This is a list of the screening recommended for you and due dates:  Health Maintenance  Topic Date Due  . DEXA scan (bone density measurement)  12/15/2009  . Mammogram  12/28/2014  . Cologuard (Stool DNA test)  03/04/2020  . Tetanus Vaccine  12/22/2025  . Flu Shot  Completed  .  Hepatitis C: One time screening is recommended by Center for Disease Control  (CDC) for  adults born from [birth year] through 1966.   Completed  . Pneumonia vaccines  Completed   Health Maintenance, Female Adopting a healthy lifestyle and getting preventive care can go a long way to promote health and wellness. Talk with your health care provider about what schedule of regular examinations is right for you. This is a good chance for you to check in with your provider about disease prevention and staying healthy. In between checkups, there are plenty of things you can do on your own. Experts have done a lot of research about which lifestyle changes and preventive measures are most likely to keep you healthy. Ask your health care provider for more information. Weight and diet Eat a healthy diet  Be sure to include plenty of vegetables, fruits, low-fat dairy products, and lean protein.  Do not eat a lot of foods high in solid fats, added sugars, or salt.  Get  regular exercise. This is one of the most important things you can do for your health. ? Most adults should exercise for at least 150 minutes each week. The exercise should increase your heart rate and make you sweat (moderate-intensity exercise). ? Most adults should also do strengthening exercises at least twice a week. This is in addition to the moderate-intensity exercise. Maintain a healthy weight  Body mass index (BMI) is a measurement that can be used to identify possible weight problems. It estimates body fat based on height and weight. Your health care provider can help determine your BMI and help you achieve or maintain a healthy weight.  For females 6 years of age and older: ? A BMI below 18.5 is considered underweight. ? A BMI of 18.5 to 24.9 is normal. ? A BMI of 25 to 29.9 is considered overweight. ? A BMI of 30 and above is considered obese. Watch levels of cholesterol and blood lipids  You should start having your blood tested for lipids and cholesterol at 74 years of age, then have this test every 5 years.  You may need to have your cholesterol levels checked more often if: ? Your lipid or cholesterol levels are high. ? You are older than 74 years of age. ? You are at high risk for heart disease. Cancer screening Lung Cancer  Lung cancer screening is recommended for adults 39-60 years old who are at high risk for  lung cancer because of a history of smoking.  A yearly low-dose CT scan of the lungs is recommended for people who: ? Currently smoke. ? Have quit within the past 15 years. ? Have at least a 30-pack-year history of smoking. A pack year is smoking an average of one pack of cigarettes a day for 1 year.  Yearly screening should continue until it has been 15 years since you quit.  Yearly screening should stop if you develop a health problem that would prevent you from having lung cancer treatment. Breast Cancer  Practice breast self-awareness. This means  understanding how your breasts normally appear and feel.  It also means doing regular breast self-exams. Let your health care provider know about any changes, no matter how small.  If you are in your 20s or 30s, you should have a clinical breast exam (CBE) by a health care provider every 1-3 years as part of a regular health exam.  If you are 34 or older, have a CBE every year. Also consider having a breast X-ray (mammogram) every year.  If you have a family history of breast cancer, talk to your health care provider about genetic screening.  If you are at high risk for breast cancer, talk to your health care provider about having an MRI and a mammogram every year.  Breast cancer gene (BRCA) assessment is recommended for women who have family members with BRCA-related cancers. BRCA-related cancers include: ? Breast. ? Ovarian. ? Tubal. ? Peritoneal cancers.  Results of the assessment will determine the need for genetic counseling and BRCA1 and BRCA2 testing. Cervical Cancer Your health care provider may recommend that you be screened regularly for cancer of the pelvic organs (ovaries, uterus, and vagina). This screening involves a pelvic examination, including checking for microscopic changes to the surface of your cervix (Pap test). You may be encouraged to have this screening done every 3 years, beginning at age 19.  For women ages 35-65, health care providers may recommend pelvic exams and Pap testing every 3 years, or they may recommend the Pap and pelvic exam, combined with testing for human papilloma virus (HPV), every 5 years. Some types of HPV increase your risk of cervical cancer. Testing for HPV may also be done on women of any age with unclear Pap test results.  Other health care providers may not recommend any screening for nonpregnant women who are considered low risk for pelvic cancer and who do not have symptoms. Ask your health care provider if a screening pelvic exam is right  for you.  If you have had past treatment for cervical cancer or a condition that could lead to cancer, you need Pap tests and screening for cancer for at least 20 years after your treatment. If Pap tests have been discontinued, your risk factors (such as having a new sexual partner) need to be reassessed to determine if screening should resume. Some women have medical problems that increase the chance of getting cervical cancer. In these cases, your health care provider may recommend more frequent screening and Pap tests. Colorectal Cancer  This type of cancer can be detected and often prevented.  Routine colorectal cancer screening usually begins at 74 years of age and continues through 74 years of age.  Your health care provider may recommend screening at an earlier age if you have risk factors for colon cancer.  Your health care provider may also recommend using home test kits to check for hidden blood in the stool.  A small camera at the end of a tube can be used to examine your colon directly (sigmoidoscopy or colonoscopy). This is done to check for the earliest forms of colorectal cancer.  Routine screening usually begins at age 60.  Direct examination of the colon should be repeated every 5-10 years through 74 years of age. However, you may need to be screened more often if early forms of precancerous polyps or small growths are found. Skin Cancer  Check your skin from head to toe regularly.  Tell your health care provider about any new moles or changes in moles, especially if there is a change in a mole's shape or color.  Also tell your health care provider if you have a mole that is larger than the size of a pencil eraser.  Always use sunscreen. Apply sunscreen liberally and repeatedly throughout the day.  Protect yourself by wearing long sleeves, pants, a wide-brimmed hat, and sunglasses whenever you are outside. Heart disease, diabetes, and high blood pressure  High blood  pressure causes heart disease and increases the risk of stroke. High blood pressure is more likely to develop in: ? People who have blood pressure in the high end of the normal range (130-139/85-89 mm Hg). ? People who are overweight or obese. ? People who are African American.  If you are 58-56 years of age, have your blood pressure checked every 3-5 years. If you are 47 years of age or older, have your blood pressure checked every year. You should have your blood pressure measured twice-once when you are at a hospital or clinic, and once when you are not at a hospital or clinic. Record the average of the two measurements. To check your blood pressure when you are not at a hospital or clinic, you can use: ? An automated blood pressure machine at a pharmacy. ? A home blood pressure monitor.  If you are between 66 years and 40 years old, ask your health care provider if you should take aspirin to prevent strokes.  Have regular diabetes screenings. This involves taking a blood sample to check your fasting blood sugar level. ? If you are at a normal weight and have a low risk for diabetes, have this test once every three years after 74 years of age. ? If you are overweight and have a high risk for diabetes, consider being tested at a younger age or more often. Preventing infection Hepatitis B  If you have a higher risk for hepatitis B, you should be screened for this virus. You are considered at high risk for hepatitis B if: ? You were born in a country where hepatitis B is common. Ask your health care provider which countries are considered high risk. ? Your parents were born in a high-risk country, and you have not been immunized against hepatitis B (hepatitis B vaccine). ? You have HIV or AIDS. ? You use needles to inject street drugs. ? You live with someone who has hepatitis B. ? You have had sex with someone who has hepatitis B. ? You get hemodialysis treatment. ? You take certain  medicines for conditions, including cancer, organ transplantation, and autoimmune conditions. Hepatitis C  Blood testing is recommended for: ? Everyone born from [birth year] through 1966. ? Anyone with known risk factors for hepatitis C. Sexually transmitted infections (STIs)  You should be screened for sexually transmitted infections (STIs) including gonorrhea and chlamydia if: ? You are sexually active and are younger than 74 years of age. ?  You are older than 74 years of age and your health care provider tells you that you are at risk for this type of infection. ? Your sexual activity has changed since you were last screened and you are at an increased risk for chlamydia or gonorrhea. Ask your health care provider if you are at risk.  If you do not have HIV, but are at risk, it may be recommended that you take a prescription medicine daily to prevent HIV infection. This is called pre-exposure prophylaxis (PrEP). You are considered at risk if: ? You are sexually active and do not regularly use condoms or know the HIV status of your partner(s). ? You take drugs by injection. ? You are sexually active with a partner who has HIV. Talk with your health care provider about whether you are at high risk of being infected with HIV. If you choose to begin PrEP, you should first be tested for HIV. You should then be tested every 3 months for as long as you are taking PrEP. Pregnancy  If you are premenopausal and you may become pregnant, ask your health care provider about preconception counseling.  If you may become pregnant, take 400 to 800 micrograms (mcg) of folic acid every day.  If you want to prevent pregnancy, talk to your health care provider about birth control (contraception). Osteoporosis and menopause  Osteoporosis is a disease in which the bones lose minerals and strength with aging. This can result in serious bone fractures. Your risk for osteoporosis can be identified using a bone density  scan.  If you are 78 years of age or older, or if you are at risk for osteoporosis and fractures, ask your health care provider if you should be screened.  Ask your health care provider whether you should take a calcium or vitamin D supplement to lower your risk for osteoporosis.  Menopause may have certain physical symptoms and risks.  Hormone replacement therapy may reduce some of these symptoms and risks. Talk to your health care provider about whether hormone replacement therapy is right for you. Follow these instructions at home:  Schedule regular health, dental, and eye exams.  Stay current with your immunizations.  Do not use any tobacco products including cigarettes, chewing tobacco, or electronic cigarettes.  If you are pregnant, do not drink alcohol.  If you are breastfeeding, limit how much and how often you drink alcohol.  Limit alcohol intake to no more than 1 drink per day for nonpregnant women. One drink equals 12 ounces of beer, 5 ounces of Marik Sedore, or 1 ounces of hard liquor.  Do not use street drugs.  Do not share needles.  Ask your health care provider for help if you need support or information about quitting drugs.  Tell your health care provider if you often feel depressed.  Tell your health care provider if you have ever been abused or do not feel safe at home. This information is not intended to replace advice given to you by your health care provider. Make sure you discuss any questions you have with your health care provider. Document Released: 01/05/2011 Document Revised: 11/28/2015 Document Reviewed: 03/26/2015 Elsevier Interactive Patient Education  2019 [employer].

## 2018-08-24 NOTE — Assessment & Plan Note (Signed)
Continue current medication Reminded to go to lab for TSH, already ordered

## 2018-08-24 NOTE — Assessment & Plan Note (Signed)
She will return for B12 lab in 1 month, PRIOR TO receiving her next monthly B12 injection She verbalized understanding of this plan F/U with further recommendations pending lab results - cyanocobalamin ((VITAMIN B-12)) injection 1,000 mcg

## 2018-08-24 NOTE — Progress Notes (Signed)
Medical screening examination/treatment/procedure(s) were performed by the Wellness Coach, RN. As primary care provider I was immediately available for consulation/collaboration. I agree with above documentation. Shatonya Passon, NP  

## 2018-08-25 ENCOUNTER — Other Ambulatory Visit: Payer: Self-pay | Admitting: Nurse Practitioner

## 2018-08-25 DIAGNOSIS — E538 Deficiency of other specified B group vitamins: Secondary | ICD-10-CM

## 2018-09-22 ENCOUNTER — Other Ambulatory Visit (INDEPENDENT_AMBULATORY_CARE_PROVIDER_SITE_OTHER): Payer: Medicare Other

## 2018-09-22 DIAGNOSIS — E538 Deficiency of other specified B group vitamins: Secondary | ICD-10-CM | POA: Diagnosis not present

## 2018-09-22 LAB — VITAMIN B12: Vitamin B-12: 268 pg/mL (ref 211–911)

## 2018-09-23 ENCOUNTER — Other Ambulatory Visit: Payer: Self-pay

## 2018-09-23 ENCOUNTER — Ambulatory Visit (INDEPENDENT_AMBULATORY_CARE_PROVIDER_SITE_OTHER): Payer: Medicare Other

## 2018-09-23 DIAGNOSIS — E538 Deficiency of other specified B group vitamins: Secondary | ICD-10-CM

## 2018-09-23 MED ORDER — CYANOCOBALAMIN 1000 MCG/ML IJ SOLN
1000.0000 ug | Freq: Once | INTRAMUSCULAR | Status: AC
  Administered 2018-09-23: 1000 ug via INTRAMUSCULAR

## 2018-09-23 NOTE — Progress Notes (Signed)
I have reviewed administration record and agree with treatment.  

## 2018-10-17 ENCOUNTER — Other Ambulatory Visit: Payer: Self-pay | Admitting: *Deleted

## 2018-10-17 DIAGNOSIS — E785 Hyperlipidemia, unspecified: Secondary | ICD-10-CM

## 2018-10-17 DIAGNOSIS — R609 Edema, unspecified: Secondary | ICD-10-CM

## 2018-10-17 DIAGNOSIS — I10 Essential (primary) hypertension: Secondary | ICD-10-CM

## 2018-10-17 MED ORDER — QUINAPRIL HCL 40 MG PO TABS: 60.0000 mg | ORAL_TABLET | Freq: Every day | ORAL | 0 refills | Status: DC

## 2018-10-17 MED ORDER — PRAVASTATIN SODIUM 40 MG PO TABS: 40.0000 mg | ORAL_TABLET | Freq: Every day | ORAL | 0 refills | Status: DC

## 2018-10-17 MED ORDER — FUROSEMIDE 20 MG PO TABS: 20.0000 mg | ORAL_TABLET | Freq: Two times a day (BID) | ORAL | 0 refills | Status: DC

## 2018-10-24 ENCOUNTER — Ambulatory Visit: Payer: Medicare Other

## 2018-11-05 ENCOUNTER — Other Ambulatory Visit: Payer: Self-pay | Admitting: Internal Medicine

## 2018-11-05 DIAGNOSIS — R609 Edema, unspecified: Secondary | ICD-10-CM

## 2018-11-05 DIAGNOSIS — E785 Hyperlipidemia, unspecified: Secondary | ICD-10-CM

## 2018-11-05 DIAGNOSIS — I10 Essential (primary) hypertension: Secondary | ICD-10-CM

## 2018-11-08 ENCOUNTER — Other Ambulatory Visit: Payer: Self-pay | Admitting: *Deleted

## 2018-11-08 DIAGNOSIS — E039 Hypothyroidism, unspecified: Secondary | ICD-10-CM

## 2018-11-08 MED ORDER — LEVOTHYROXINE SODIUM 75 MCG PO TABS: ORAL_TABLET | ORAL | 0 refills | Status: DC

## 2018-12-19 ENCOUNTER — Ambulatory Visit (INDEPENDENT_AMBULATORY_CARE_PROVIDER_SITE_OTHER): Payer: Medicare Other

## 2018-12-19 DIAGNOSIS — E538 Deficiency of other specified B group vitamins: Secondary | ICD-10-CM

## 2018-12-19 MED ORDER — CYANOCOBALAMIN 1000 MCG/ML IJ SOLN
1000.0000 ug | Freq: Once | INTRAMUSCULAR | Status: AC
  Administered 2018-12-19: 1000 ug via INTRAMUSCULAR

## 2019-01-03 ENCOUNTER — Other Ambulatory Visit: Payer: Self-pay

## 2019-01-03 ENCOUNTER — Emergency Department (HOSPITAL_COMMUNITY)
Admission: EM | Admit: 2019-01-03 | Discharge: 2019-01-03 | Disposition: A | Discharge status: Home / Self Care | Payer: Medicare Other | Attending: Emergency Medicine | Admitting: Emergency Medicine

## 2019-01-03 ENCOUNTER — Encounter (HOSPITAL_COMMUNITY): Payer: Self-pay

## 2019-01-03 DIAGNOSIS — I10 Essential (primary) hypertension: Secondary | ICD-10-CM | POA: Insufficient documentation

## 2019-01-03 DIAGNOSIS — R040 Epistaxis: Secondary | ICD-10-CM

## 2019-01-03 DIAGNOSIS — E039 Hypothyroidism, unspecified: Secondary | ICD-10-CM | POA: Insufficient documentation

## 2019-01-03 DIAGNOSIS — Z79899 Other long term (current) drug therapy: Secondary | ICD-10-CM | POA: Insufficient documentation

## 2019-01-03 MED ORDER — OXYMETAZOLINE HCL 0.05 % NA SOLN
1.0000 | Freq: Once | NASAL | Status: AC
  Administered 2019-01-03: 1 via NASAL
  Filled 2019-01-03: qty 30
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
## 2019-01-03 NOTE — ED Triage Notes (Signed)
Patient c/o left nosebleed. Small amount of bleeding noted while in triage. Patient states last nosebleed was 3-4 years. Patient denies taking blood thinners.

## 2019-01-04 NOTE — Progress Notes (Signed)
Agree with treatment 

## 2019-01-17 ENCOUNTER — Telehealth: Payer: Self-pay | Admitting: Emergency Medicine

## 2019-01-17 NOTE — Telephone Encounter (Signed)
Pt needs to Establish with new PCP, please advise if you are willing to accept pt.

## 2019-01-20 ENCOUNTER — Ambulatory Visit (INDEPENDENT_AMBULATORY_CARE_PROVIDER_SITE_OTHER): Payer: Medicare Other | Admitting: *Deleted

## 2019-01-20 ENCOUNTER — Other Ambulatory Visit: Payer: Self-pay

## 2019-01-20 DIAGNOSIS — E538 Deficiency of other specified B group vitamins: Secondary | ICD-10-CM | POA: Diagnosis not present

## 2019-01-20 MED ORDER — CYANOCOBALAMIN 1000 MCG/ML IJ SOLN
1000.0000 ug | Freq: Once | INTRAMUSCULAR | Status: AC
  Administered 2019-01-20: 1000 ug via INTRAMUSCULAR

## 2019-01-20 NOTE — Progress Notes (Signed)
PCP is not here.Pls cosign for B12

## 2019-01-20 NOTE — Telephone Encounter (Signed)
Fine to establish care 

## 2019-02-06 ENCOUNTER — Other Ambulatory Visit: Payer: Self-pay | Admitting: Family

## 2019-02-06 ENCOUNTER — Encounter: Payer: Self-pay | Admitting: Family

## 2019-02-06 ENCOUNTER — Other Ambulatory Visit (INDEPENDENT_AMBULATORY_CARE_PROVIDER_SITE_OTHER): Payer: Medicare Other

## 2019-02-06 ENCOUNTER — Ambulatory Visit (INDEPENDENT_AMBULATORY_CARE_PROVIDER_SITE_OTHER): Payer: Medicare Other | Admitting: Family

## 2019-02-06 ENCOUNTER — Other Ambulatory Visit: Payer: Self-pay

## 2019-02-06 VITALS — BP 126/76 | HR 67 | Temp 97.9°F | Ht 65.0 in | Wt 229.0 lb

## 2019-02-06 DIAGNOSIS — I10 Essential (primary) hypertension: Secondary | ICD-10-CM | POA: Diagnosis not present

## 2019-02-06 DIAGNOSIS — E785 Hyperlipidemia, unspecified: Secondary | ICD-10-CM | POA: Diagnosis not present

## 2019-02-06 DIAGNOSIS — E039 Hypothyroidism, unspecified: Secondary | ICD-10-CM | POA: Diagnosis not present

## 2019-02-06 DIAGNOSIS — E2839 Other primary ovarian failure: Secondary | ICD-10-CM | POA: Diagnosis not present

## 2019-02-06 DIAGNOSIS — Z1231 Encounter for screening mammogram for malignant neoplasm of breast: Secondary | ICD-10-CM

## 2019-02-06 DIAGNOSIS — R609 Edema, unspecified: Secondary | ICD-10-CM | POA: Diagnosis not present

## 2019-02-06 LAB — CBC WITH DIFFERENTIAL/PLATELET
Basophils Absolute: 0.1 10*3/uL (ref 0.0–0.1)
Basophils Relative: 1.3 % (ref 0.0–3.0)
Eosinophils Absolute: 0.2 10*3/uL (ref 0.0–0.7)
Eosinophils Relative: 3.7 % (ref 0.0–5.0)
HCT: 39.1 % (ref 36.0–46.0)
Hemoglobin: 13 g/dL (ref 12.0–15.0)
Lymphocytes Relative: 37.9 % (ref 12.0–46.0)
Lymphs Abs: 2.5 10*3/uL (ref 0.7–4.0)
MCHC: 33.3 g/dL (ref 30.0–36.0)
MCV: 96.9 fl (ref 78.0–100.0)
Monocytes Absolute: 0.7 10*3/uL (ref 0.1–1.0)
Monocytes Relative: 10.2 % (ref 3.0–12.0)
Neutro Abs: 3.1 10*3/uL (ref 1.4–7.7)
Neutrophils Relative %: 46.9 % (ref 43.0–77.0)
Platelets: 306 10*3/uL (ref 150.0–400.0)
RBC: 4.03 Mil/uL (ref 3.87–5.11)
RDW: 13.7 % (ref 11.5–15.5)
WBC: 6.7 10*3/uL (ref 4.0–10.5)

## 2019-02-06 LAB — COMPREHENSIVE METABOLIC PANEL
ALT: 13 U/L (ref 0–35)
AST: 14 U/L (ref 0–37)
Albumin: 3.9 g/dL (ref 3.5–5.2)
Alkaline Phosphatase: 91 U/L (ref 39–117)
BUN: 11 mg/dL (ref 6–23)
CO2: 26 mEq/L (ref 19–32)
Calcium: 8.7 mg/dL (ref 8.4–10.5)
Chloride: 105 mEq/L (ref 96–112)
Creatinine, Ser: 0.61 mg/dL (ref 0.40–1.20)
GFR: 115.97 mL/min (ref >60.00)
Glucose, Bld: 80 mg/dL (ref 70–99)
Potassium: 3.7 mEq/L (ref 3.5–5.1)
Sodium: 139 mEq/L (ref 135–145)
Total Bilirubin: 0.3 mg/dL (ref 0.2–1.2)
Total Protein: 7.4 g/dL (ref 6.0–8.3)

## 2019-02-06 LAB — LIPID PANEL
Cholesterol: 157 mg/dL (ref 0–200)
HDL: 56.4 mg/dL (ref >39.00)
LDL Cholesterol: 86 mg/dL (ref 0–99)
NonHDL: 100.47
Total CHOL/HDL Ratio: 3
Triglycerides: 71 mg/dL (ref 0.0–149.0)
VLDL: 14.2 mg/dL (ref 0.0–40.0)

## 2019-02-06 LAB — VITAMIN D 25 HYDROXY (VIT D DEFICIENCY, FRACTURES): VITD: 13.85 ng/mL — ABNORMAL LOW (ref 30.00–100.00)

## 2019-02-06 LAB — TSH: TSH: 1.95 u[IU]/mL (ref 0.35–4.50)

## 2019-02-06 MED ORDER — PRAVASTATIN SODIUM 40 MG PO TABS: ORAL_TABLET | ORAL | 1 refills | Status: DC

## 2019-02-06 MED ORDER — POTASSIUM CHLORIDE ER 8 MEQ PO TBCR: 8.0000 meq | EXTENDED_RELEASE_TABLET | Freq: Every day | ORAL | 1 refills | Status: DC

## 2019-02-06 MED ORDER — QUINAPRIL HCL 40 MG PO TABS: ORAL_TABLET | ORAL | 1 refills | Status: DC

## 2019-02-06 MED ORDER — VITAMIN D (ERGOCALCIFEROL) 1.25 MG (50000 UNIT) PO CAPS: 50000.0000 [IU] | ORAL_CAPSULE | ORAL | 0 refills | Status: AC

## 2019-02-06 MED ORDER — LEVOTHYROXINE SODIUM 75 MCG PO TABS: ORAL_TABLET | ORAL | 1 refills | Status: DC

## 2019-02-06 MED ORDER — AMLODIPINE BESYLATE 5 MG PO TABS: ORAL_TABLET | ORAL | 1 refills | Status: DC

## 2019-02-06 MED ORDER — FUROSEMIDE 20 MG PO TABS: ORAL_TABLET | ORAL | 1 refills | Status: DC
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
## 2019-02-22 ENCOUNTER — Ambulatory Visit: Payer: Medicare Other | Admitting: Nurse Practitioner

## 2019-02-22 ENCOUNTER — Ambulatory Visit (INDEPENDENT_AMBULATORY_CARE_PROVIDER_SITE_OTHER): Payer: Medicare Other

## 2019-02-22 ENCOUNTER — Other Ambulatory Visit: Payer: Self-pay

## 2019-02-22 ENCOUNTER — Ambulatory Visit (INDEPENDENT_AMBULATORY_CARE_PROVIDER_SITE_OTHER)
Admission: RE | Admit: 2019-02-22 | Discharge: 2019-02-22 | Disposition: A | Discharge status: Home / Self Care | Payer: Medicare Other | Source: Ambulatory Visit | Attending: Family | Admitting: Family

## 2019-02-22 DIAGNOSIS — E538 Deficiency of other specified B group vitamins: Secondary | ICD-10-CM | POA: Diagnosis not present

## 2019-02-22 DIAGNOSIS — E2839 Other primary ovarian failure: Secondary | ICD-10-CM | POA: Diagnosis not present

## 2019-02-22 MED ORDER — CYANOCOBALAMIN 1000 MCG/ML IJ SOLN
1000.0000 ug | Freq: Once | INTRAMUSCULAR | Status: AC
  Administered 2019-02-22: 1000 ug via INTRAMUSCULAR

## 2019-03-01 NOTE — Progress Notes (Signed)
b12 Injection given.   Beyonka Pitney J Gradyn Shein, MD  

## 2019-03-17 ENCOUNTER — Other Ambulatory Visit: Payer: Self-pay | Admitting: Family

## 2019-03-17 ENCOUNTER — Telehealth: Payer: Self-pay | Admitting: Family

## 2019-03-17 MED ORDER — TRAMADOL HCL 50 MG PO TABS: ORAL_TABLET | ORAL | 0 refills | Status: DC

## 2019-03-17 NOTE — Telephone Encounter (Signed)
rx refill traMADol Veatrice Bourbon) 50 MG  Pharmacy  [facility] [phone] - [place], [address] AT [facility] [place] [phone] (Phone) [phone] (Fax

## 2019-03-29 ENCOUNTER — Other Ambulatory Visit: Payer: Self-pay

## 2019-03-29 ENCOUNTER — Ambulatory Visit (INDEPENDENT_AMBULATORY_CARE_PROVIDER_SITE_OTHER): Payer: Medicare Other

## 2019-03-29 DIAGNOSIS — E538 Deficiency of other specified B group vitamins: Secondary | ICD-10-CM | POA: Diagnosis not present

## 2019-03-29 MED ORDER — CYANOCOBALAMIN 1000 MCG/ML IJ SOLN
1000.0000 ug | Freq: Once | INTRAMUSCULAR | Status: AC
  Administered 2019-03-29: 16:00:00 1000 ug via INTRAMUSCULAR

## 2019-03-31 NOTE — Progress Notes (Signed)
Agree with treatment provided

## 2019-04-28 ENCOUNTER — Ambulatory Visit (INDEPENDENT_AMBULATORY_CARE_PROVIDER_SITE_OTHER): Payer: Medicare Other

## 2019-04-28 ENCOUNTER — Other Ambulatory Visit: Payer: Self-pay

## 2019-04-28 DIAGNOSIS — E538 Deficiency of other specified B group vitamins: Secondary | ICD-10-CM | POA: Diagnosis not present

## 2019-04-28 MED ORDER — CYANOCOBALAMIN 1000 MCG/ML IJ SOLN
1000.0000 ug | Freq: Once | INTRAMUSCULAR | Status: AC
  Administered 2019-04-28: 14:00:00 1000 ug via INTRAMUSCULAR

## 2019-04-28 NOTE — Progress Notes (Signed)
Medical screening examination/treatment/procedure(s) were performed by non-physician practitioner and as supervising physician I was immediately available for consultation/collaboration. I agree with above. Mariacristina Aday, MD   

## 2019-05-03 ENCOUNTER — Other Ambulatory Visit: Payer: Self-pay | Admitting: Family

## 2019-05-26 ENCOUNTER — Ambulatory Visit (INDEPENDENT_AMBULATORY_CARE_PROVIDER_SITE_OTHER): Payer: Medicare Other

## 2019-05-26 ENCOUNTER — Other Ambulatory Visit: Payer: Self-pay

## 2019-05-26 DIAGNOSIS — E538 Deficiency of other specified B group vitamins: Secondary | ICD-10-CM

## 2019-05-26 MED ORDER — CYANOCOBALAMIN 1000 MCG/ML IJ SOLN
1000.0000 ug | Freq: Once | INTRAMUSCULAR | Status: AC
  Administered 2019-05-26: 1000 ug via INTRAMUSCULAR

## 2019-05-26 NOTE — Progress Notes (Signed)
cya

## 2019-06-06 NOTE — Progress Notes (Signed)
b12 Injection given.   Stacy J Burns, MD  

## 2019-06-22 ENCOUNTER — Other Ambulatory Visit: Payer: Self-pay

## 2019-06-22 ENCOUNTER — Ambulatory Visit (INDEPENDENT_AMBULATORY_CARE_PROVIDER_SITE_OTHER): Payer: Medicare Other

## 2019-06-22 DIAGNOSIS — E538 Deficiency of other specified B group vitamins: Secondary | ICD-10-CM | POA: Diagnosis not present

## 2019-06-22 MED ORDER — CYANOCOBALAMIN 1000 MCG/ML IJ SOLN
1000.0000 ug | Freq: Once | INTRAMUSCULAR | Status: AC
  Administered 2019-06-22: 1000 ug via INTRAMUSCULAR

## 2019-06-23 ENCOUNTER — Ambulatory Visit: Payer: Medicare Other

## 2019-06-27 NOTE — Progress Notes (Signed)
Agree with treatment 

## 2019-07-24 ENCOUNTER — Ambulatory Visit (INDEPENDENT_AMBULATORY_CARE_PROVIDER_SITE_OTHER): Payer: Medicare Other | Admitting: *Deleted

## 2019-07-24 ENCOUNTER — Other Ambulatory Visit: Payer: Self-pay

## 2019-07-24 DIAGNOSIS — E538 Deficiency of other specified B group vitamins: Secondary | ICD-10-CM

## 2019-07-24 MED ORDER — CYANOCOBALAMIN 1000 MCG/ML IJ SOLN
1000.0000 ug | Freq: Once | INTRAMUSCULAR | Status: AC
  Administered 2019-07-24: 1000 ug via INTRAMUSCULAR

## 2019-07-31 NOTE — Progress Notes (Signed)
Agree with treatment 

## 2019-08-02 ENCOUNTER — Other Ambulatory Visit: Payer: Self-pay | Admitting: Family

## 2019-08-02 DIAGNOSIS — I10 Essential (primary) hypertension: Secondary | ICD-10-CM

## 2019-08-02 DIAGNOSIS — R609 Edema, unspecified: Secondary | ICD-10-CM

## 2019-08-02 DIAGNOSIS — E785 Hyperlipidemia, unspecified: Secondary | ICD-10-CM

## 2019-08-23 ENCOUNTER — Ambulatory Visit (INDEPENDENT_AMBULATORY_CARE_PROVIDER_SITE_OTHER): Payer: Medicare Other | Admitting: *Deleted

## 2019-08-23 ENCOUNTER — Other Ambulatory Visit: Payer: Self-pay

## 2019-08-23 DIAGNOSIS — E538 Deficiency of other specified B group vitamins: Secondary | ICD-10-CM

## 2019-08-23 MED ORDER — CYANOCOBALAMIN 1000 MCG/ML IJ SOLN
1000.0000 ug | Freq: Once | INTRAMUSCULAR | Status: AC
  Administered 2019-08-23: 16:00:00 1000 ug via INTRAMUSCULAR

## 2019-08-23 NOTE — Progress Notes (Addendum)
Pls cosign for pt B12 due to PCP not in this afternoon..Dorothy Ritter   b12 Injection given.   Pincus Sanes, MD

## 2019-08-24 ENCOUNTER — Ambulatory Visit: Payer: Medicare Other

## 2019-09-20 ENCOUNTER — Ambulatory Visit: Payer: Medicare Other

## 2019-09-21 ENCOUNTER — Other Ambulatory Visit: Payer: Self-pay

## 2019-09-21 ENCOUNTER — Ambulatory Visit (INDEPENDENT_AMBULATORY_CARE_PROVIDER_SITE_OTHER): Payer: Medicare Other | Admitting: *Deleted

## 2019-09-21 DIAGNOSIS — E538 Deficiency of other specified B group vitamins: Secondary | ICD-10-CM

## 2019-09-21 MED ORDER — CYANOCOBALAMIN 1000 MCG/ML IJ SOLN
1000.0000 ug | Freq: Once | INTRAMUSCULAR | Status: AC
  Administered 2019-09-21: 1000 ug via INTRAMUSCULAR

## 2019-09-21 NOTE — Progress Notes (Addendum)
.  Pls cosign for B12 since PCP is not in today..[name]   b12 Injection given.   Pincus Sanes, MD

## 2019-10-03 ENCOUNTER — Telehealth: Payer: Self-pay | Admitting: Family

## 2019-10-03 NOTE — Telephone Encounter (Signed)
Patient is requesting a refill on the following medication.  traMADol (ULTRAM) 50 MG tablet  Pharmacy on file.   LOV: 02/06/2019

## 2019-10-04 ENCOUNTER — Other Ambulatory Visit: Payer: Self-pay | Admitting: Family

## 2019-10-04 MED ORDER — TRAMADOL HCL 50 MG PO TABS: ORAL_TABLET | ORAL | 0 refills | Status: DC

## 2019-10-04 NOTE — Telephone Encounter (Signed)
I gave her #20; she has an appointment in April with nurse- I can't tell if that is for AWV or for B12. If not for AWV, please ask her to schedule with our new nurse please.

## 2019-10-17 NOTE — Telephone Encounter (Signed)
Dorothy Ritter will call patient and get her set up.

## 2019-10-23 ENCOUNTER — Ambulatory Visit: Payer: Medicare Other

## 2019-10-27 ENCOUNTER — Ambulatory Visit (INDEPENDENT_AMBULATORY_CARE_PROVIDER_SITE_OTHER): Payer: Medicare Other | Admitting: *Deleted

## 2019-10-27 ENCOUNTER — Other Ambulatory Visit: Payer: Self-pay

## 2019-10-27 DIAGNOSIS — E538 Deficiency of other specified B group vitamins: Secondary | ICD-10-CM

## 2019-10-27 MED ORDER — CYANOCOBALAMIN 1000 MCG/ML IJ SOLN
1000.0000 ug | Freq: Once | INTRAMUSCULAR | Status: AC
  Administered 2019-10-27: 1000 ug via INTRAMUSCULAR

## 2019-10-27 NOTE — Progress Notes (Signed)
Pls cosign for B12 since Vernona Rieger is out of the office today.Marland KitchenRaechel Chute

## 2019-11-15 ENCOUNTER — Telehealth (INDEPENDENT_AMBULATORY_CARE_PROVIDER_SITE_OTHER): Payer: Medicare Other | Admitting: Family

## 2019-11-15 ENCOUNTER — Encounter: Payer: Self-pay | Admitting: Family

## 2019-11-15 DIAGNOSIS — J209 Acute bronchitis, unspecified: Secondary | ICD-10-CM | POA: Diagnosis not present

## 2019-11-15 MED ORDER — ALBUTEROL SULFATE HFA 108 (90 BASE) MCG/ACT IN AERS: 2.0000 | INHALATION_SPRAY | Freq: Four times a day (QID) | RESPIRATORY_TRACT | 1 refills | Status: DC | PRN

## 2019-11-15 MED ORDER — AZITHROMYCIN 250 MG PO TABS: ORAL_TABLET | ORAL | 0 refills | Status: DC
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
## 2019-11-27 ENCOUNTER — Ambulatory Visit (INDEPENDENT_AMBULATORY_CARE_PROVIDER_SITE_OTHER): Payer: Medicare Other

## 2019-11-27 ENCOUNTER — Other Ambulatory Visit: Payer: Self-pay

## 2019-11-27 DIAGNOSIS — E538 Deficiency of other specified B group vitamins: Secondary | ICD-10-CM | POA: Diagnosis not present

## 2019-11-27 MED ORDER — CYANOCOBALAMIN 1000 MCG/ML IJ SOLN
1000.0000 ug | Freq: Once | INTRAMUSCULAR | Status: AC
  Administered 2019-11-27: 1000 ug via INTRAMUSCULAR

## 2019-11-27 NOTE — Progress Notes (Signed)
Please sign off. Vit B12 given today.

## 2019-12-28 ENCOUNTER — Other Ambulatory Visit: Payer: Self-pay

## 2019-12-28 ENCOUNTER — Ambulatory Visit (INDEPENDENT_AMBULATORY_CARE_PROVIDER_SITE_OTHER): Payer: Medicare Other

## 2019-12-28 DIAGNOSIS — E538 Deficiency of other specified B group vitamins: Secondary | ICD-10-CM | POA: Diagnosis not present

## 2019-12-28 MED ORDER — CYANOCOBALAMIN 1000 MCG/ML IJ SOLN
1000.0000 ug | Freq: Once | INTRAMUSCULAR | Status: AC
  Administered 2019-12-28: 1000 ug via INTRAMUSCULAR

## 2019-12-28 NOTE — Progress Notes (Signed)
Opened in error

## 2019-12-28 NOTE — Progress Notes (Addendum)
I have reviewed and agree B12 given in office today/cjs

## 2020-01-11 ENCOUNTER — Other Ambulatory Visit: Payer: Self-pay | Admitting: Family

## 2020-01-11 DIAGNOSIS — E039 Hypothyroidism, unspecified: Secondary | ICD-10-CM

## 2020-01-11 DIAGNOSIS — I10 Essential (primary) hypertension: Secondary | ICD-10-CM

## 2020-01-11 DIAGNOSIS — R609 Edema, unspecified: Secondary | ICD-10-CM

## 2020-01-31 ENCOUNTER — Other Ambulatory Visit: Payer: Self-pay

## 2020-01-31 ENCOUNTER — Ambulatory Visit (INDEPENDENT_AMBULATORY_CARE_PROVIDER_SITE_OTHER): Payer: Medicare Other | Admitting: *Deleted

## 2020-01-31 DIAGNOSIS — E538 Deficiency of other specified B group vitamins: Secondary | ICD-10-CM

## 2020-01-31 MED ORDER — CYANOCOBALAMIN 1000 MCG/ML IJ SOLN
1000.0000 ug | Freq: Once | INTRAMUSCULAR | Status: AC
  Administered 2020-01-31: 1000 ug via INTRAMUSCULAR

## 2020-01-31 NOTE — Progress Notes (Signed)
Pls cosign for B12 inj in absence of PCP../lmb   

## 2020-02-28 ENCOUNTER — Other Ambulatory Visit: Payer: Self-pay

## 2020-02-28 ENCOUNTER — Encounter: Payer: Self-pay | Admitting: Family

## 2020-02-28 ENCOUNTER — Ambulatory Visit (INDEPENDENT_AMBULATORY_CARE_PROVIDER_SITE_OTHER): Payer: Medicare Other | Admitting: Family

## 2020-02-28 ENCOUNTER — Telehealth: Payer: Self-pay

## 2020-02-28 VITALS — BP 128/82 | HR 86 | Temp 97.4°F | Ht 65.0 in | Wt 229.0 lb

## 2020-02-28 DIAGNOSIS — I10 Essential (primary) hypertension: Secondary | ICD-10-CM | POA: Diagnosis not present

## 2020-02-28 DIAGNOSIS — E559 Vitamin D deficiency, unspecified: Secondary | ICD-10-CM

## 2020-02-28 DIAGNOSIS — J069 Acute upper respiratory infection, unspecified: Secondary | ICD-10-CM

## 2020-02-28 DIAGNOSIS — Z1231 Encounter for screening mammogram for malignant neoplasm of breast: Secondary | ICD-10-CM

## 2020-02-28 DIAGNOSIS — Z0000 Encounter for general adult medical examination without abnormal findings: Secondary | ICD-10-CM | POA: Diagnosis not present

## 2020-02-28 DIAGNOSIS — E538 Deficiency of other specified B group vitamins: Secondary | ICD-10-CM | POA: Diagnosis not present

## 2020-02-28 DIAGNOSIS — E039 Hypothyroidism, unspecified: Secondary | ICD-10-CM

## 2020-02-28 DIAGNOSIS — R609 Edema, unspecified: Secondary | ICD-10-CM

## 2020-02-28 DIAGNOSIS — E785 Hyperlipidemia, unspecified: Secondary | ICD-10-CM

## 2020-02-28 MED ORDER — AMLODIPINE BESYLATE 5 MG PO TABS: ORAL_TABLET | ORAL | 3 refills | Status: DC

## 2020-02-28 MED ORDER — PRAVASTATIN SODIUM 40 MG PO TABS: 40.0000 mg | ORAL_TABLET | Freq: Every day | ORAL | 3 refills | Status: DC

## 2020-02-28 MED ORDER — QUINAPRIL HCL 40 MG PO TABS: ORAL_TABLET | ORAL | 3 refills | Status: DC

## 2020-02-28 MED ORDER — FUROSEMIDE 20 MG PO TABS: ORAL_TABLET | ORAL | 3 refills | Status: DC

## 2020-02-28 MED ORDER — CYANOCOBALAMIN 1000 MCG/ML IJ SOLN
1000.0000 ug | Freq: Once | INTRAMUSCULAR | Status: AC
  Administered 2020-02-28: 1000 ug via INTRAMUSCULAR

## 2020-02-28 MED ORDER — TRAMADOL HCL 50 MG PO TABS
ORAL_TABLET | ORAL | 0 refills | Status: DC
Start: 2020-02-28 — End: 2021-04-29

## 2020-02-28 MED ORDER — POTASSIUM CHLORIDE ER 8 MEQ PO TBCR: EXTENDED_RELEASE_TABLET | ORAL | 3 refills | Status: DC

## 2020-02-28 MED ORDER — FLUTICASONE PROPIONATE 50 MCG/ACT NA SUSP: 2.0000 | Freq: Every day | NASAL | 11 refills | Status: AC
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
## 2020-02-28 NOTE — Telephone Encounter (Signed)
Order Cologuard?

## 2020-02-29 ENCOUNTER — Other Ambulatory Visit: Payer: Self-pay | Admitting: Family

## 2020-02-29 LAB — CBC WITH DIFFERENTIAL/PLATELET
Absolute Monocytes: 646 cells/uL (ref 200–950)
Basophils Absolute: 88 cells/uL (ref 0–200)
Basophils Relative: 1.3 %
Eosinophils Absolute: 245 cells/uL (ref 15–500)
Eosinophils Relative: 3.6 %
HCT: 38.8 % (ref 35.0–45.0)
Hemoglobin: 12.9 g/dL (ref 11.7–15.5)
Lymphs Abs: 2373 cells/uL (ref 850–3900)
MCH: 31.5 pg (ref 27.0–33.0)
MCHC: 33.2 g/dL (ref 32.0–36.0)
MCV: 94.9 fL (ref 80.0–100.0)
MPV: 11.1 fL (ref 7.5–12.5)
Monocytes Relative: 9.5 %
Neutro Abs: 3448 cells/uL (ref 1500–7800)
Neutrophils Relative %: 50.7 %
Platelets: 322 10*3/uL (ref 140–400)
RBC: 4.09 10*6/uL (ref 3.80–5.10)
RDW: 13 % (ref 11.0–15.0)
Total Lymphocyte: 34.9 %
WBC: 6.8 10*3/uL (ref 3.8–10.8)

## 2020-02-29 LAB — COMPREHENSIVE METABOLIC PANEL
AG Ratio: 1.4 (calc) (ref 1.0–2.5)
ALT: 12 U/L (ref 6–29)
AST: 14 U/L (ref 10–35)
Albumin: 3.8 g/dL (ref 3.6–5.1)
Alkaline phosphatase (APISO): 90 U/L (ref 37–153)
BUN: 13 mg/dL (ref 7–25)
CO2: 28 mmol/L (ref 20–32)
Calcium: 8.6 mg/dL (ref 8.6–10.4)
Chloride: 104 mmol/L (ref 98–110)
Creat: 0.82 mg/dL (ref 0.60–0.93)
Globulin: 2.8 g/dL (calc) (ref 1.9–3.7)
Glucose, Bld: 106 mg/dL — ABNORMAL HIGH (ref 65–99)
Potassium: 3.9 mmol/L (ref 3.5–5.3)
Sodium: 139 mmol/L (ref 135–146)
Total Bilirubin: 0.4 mg/dL (ref 0.2–1.2)
Total Protein: 6.6 g/dL (ref 6.1–8.1)

## 2020-02-29 LAB — TSH: TSH: 2.61 mIU/L (ref 0.40–4.50)

## 2020-02-29 LAB — VITAMIN B12: Vitamin B-12: >2000 pg/mL — ABNORMAL HIGH (ref 200–1100)

## 2020-02-29 LAB — VITAMIN D 25 HYDROXY (VIT D DEFICIENCY, FRACTURES): Vit D, 25-Hydroxy: 13 ng/mL — ABNORMAL LOW (ref 30–100)

## 2020-02-29 LAB — LIPID PANEL
Cholesterol: 173 mg/dL (ref <200)
HDL: 66 mg/dL (ref >=50)
LDL Cholesterol (Calc): 91 mg/dL (calc)
Non-HDL Cholesterol (Calc): 107 mg/dL (calc) (ref <130)
Total CHOL/HDL Ratio: 2.6 (calc) (ref <5.0)
Triglycerides: 73 mg/dL (ref <150)

## 2020-02-29 MED ORDER — VITAMIN D (ERGOCALCIFEROL) 1.25 MG (50000 UNIT) PO CAPS: 50000.0000 [IU] | ORAL_CAPSULE | ORAL | 0 refills | Status: AC

## 2020-03-01 ENCOUNTER — Other Ambulatory Visit: Payer: Self-pay

## 2020-03-01 DIAGNOSIS — Z1211 Encounter for screening for malignant neoplasm of colon: Secondary | ICD-10-CM

## 2020-03-01 NOTE — Telephone Encounter (Signed)
Cologuard ordered

## 2020-03-29 ENCOUNTER — Ambulatory Visit (INDEPENDENT_AMBULATORY_CARE_PROVIDER_SITE_OTHER): Payer: Medicare Other

## 2020-03-29 ENCOUNTER — Other Ambulatory Visit: Payer: Self-pay

## 2020-03-29 DIAGNOSIS — E538 Deficiency of other specified B group vitamins: Secondary | ICD-10-CM

## 2020-03-29 MED ORDER — CYANOCOBALAMIN 1000 MCG/ML IJ SOLN
1000.0000 ug | Freq: Once | INTRAMUSCULAR | Status: AC
  Administered 2020-03-29: 1000 ug via INTRAMUSCULAR

## 2020-03-29 NOTE — Progress Notes (Signed)
Pt here for monthly B12 injection per Dayton Scrape, FNP  B12 given IM, and pt tolerated injection well.  Pt is to schedule next B12 injection at check out

## 2020-04-10 ENCOUNTER — Other Ambulatory Visit: Payer: Self-pay | Admitting: Family

## 2020-04-10 DIAGNOSIS — I10 Essential (primary) hypertension: Secondary | ICD-10-CM

## 2020-04-12 ENCOUNTER — Ambulatory Visit
Admission: RE | Admit: 2020-04-12 | Discharge: 2020-04-12 | Disposition: A | Discharge status: Home / Self Care | Payer: Medicare Other | Source: Ambulatory Visit | Attending: Family | Admitting: Family

## 2020-04-12 ENCOUNTER — Other Ambulatory Visit: Payer: Self-pay

## 2020-04-12 DIAGNOSIS — Z1231 Encounter for screening mammogram for malignant neoplasm of breast: Secondary | ICD-10-CM

## 2020-04-23 ENCOUNTER — Telehealth (INDEPENDENT_AMBULATORY_CARE_PROVIDER_SITE_OTHER): Payer: Medicare Other | Admitting: Family

## 2020-04-23 DIAGNOSIS — J209 Acute bronchitis, unspecified: Secondary | ICD-10-CM | POA: Diagnosis not present

## 2020-04-23 MED ORDER — AZITHROMYCIN 250 MG PO TABS: ORAL_TABLET | ORAL | 0 refills | Status: DC
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
## 2020-04-29 ENCOUNTER — Ambulatory Visit (INDEPENDENT_AMBULATORY_CARE_PROVIDER_SITE_OTHER): Payer: Medicare Other | Admitting: *Deleted

## 2020-04-29 ENCOUNTER — Other Ambulatory Visit: Payer: Self-pay

## 2020-04-29 DIAGNOSIS — E538 Deficiency of other specified B group vitamins: Secondary | ICD-10-CM

## 2020-04-29 MED ORDER — CYANOCOBALAMIN 1000 MCG/ML IJ SOLN
1000.0000 ug | Freq: Once | INTRAMUSCULAR | Status: AC
  Administered 2020-04-29: 1000 ug via INTRAMUSCULAR

## 2020-04-29 NOTE — Progress Notes (Signed)
Pls cosign for B12 inj in absence of PCP../lmb   

## 2020-05-25 ENCOUNTER — Other Ambulatory Visit: Payer: Self-pay | Admitting: Family

## 2020-05-31 ENCOUNTER — Ambulatory Visit: Payer: Medicare Other

## 2020-06-03 ENCOUNTER — Other Ambulatory Visit: Payer: Self-pay

## 2020-06-03 ENCOUNTER — Ambulatory Visit: Payer: Medicare Other

## 2020-06-03 ENCOUNTER — Other Ambulatory Visit (INDEPENDENT_AMBULATORY_CARE_PROVIDER_SITE_OTHER): Payer: Medicare Other

## 2020-06-03 DIAGNOSIS — E538 Deficiency of other specified B group vitamins: Secondary | ICD-10-CM

## 2020-06-03 DIAGNOSIS — Z23 Encounter for immunization: Secondary | ICD-10-CM | POA: Diagnosis not present

## 2020-06-03 MED ORDER — CYANOCOBALAMIN 1000 MCG/ML IJ SOLN
1000.0000 ug | Freq: Once | INTRAMUSCULAR | Status: AC
  Administered 2020-06-03: 1000 ug via INTRAMUSCULAR

## 2020-06-03 NOTE — Progress Notes (Signed)
B12 given today and was ordered by Ria Clock, FNP. Patient tolerated injection well.

## 2020-07-03 ENCOUNTER — Other Ambulatory Visit: Payer: Self-pay

## 2020-07-03 ENCOUNTER — Ambulatory Visit (INDEPENDENT_AMBULATORY_CARE_PROVIDER_SITE_OTHER): Payer: Medicare Other

## 2020-07-03 ENCOUNTER — Telehealth: Payer: Self-pay

## 2020-07-03 DIAGNOSIS — E538 Deficiency of other specified B group vitamins: Secondary | ICD-10-CM | POA: Diagnosis not present

## 2020-07-03 MED ORDER — CYANOCOBALAMIN 1000 MCG/ML IJ SOLN
1000.0000 ug | Freq: Once | INTRAMUSCULAR | Status: AC
  Administered 2020-07-03: 1000 ug via INTRAMUSCULAR

## 2020-07-03 NOTE — Telephone Encounter (Signed)
Need updated monthly B12 injection order.  Will send to PCP.  Last CPE 02/28/20.

## 2020-07-03 NOTE — Progress Notes (Addendum)
Pt here for monthly B12 injection per Ria Clock, FNP  B12 given IM right deltoid and pt tolerated injection well.  Dr Posey Rea: please cosign since PCP is out of office.   Medical screening examination/treatment/procedure(s) were performed by non-physician practitioner and as supervising physician I was immediately available for consultation/collaboration.  I agree with above. Jacinta Shoe, MD

## 2020-07-07 ENCOUNTER — Other Ambulatory Visit: Payer: Self-pay | Admitting: Family

## 2020-07-07 DIAGNOSIS — E538 Deficiency of other specified B group vitamins: Secondary | ICD-10-CM

## 2020-07-07 MED ORDER — CYANOCOBALAMIN 1000 MCG/ML IJ SOLN
1000.0000 ug | INTRAMUSCULAR | Status: DC
  Administered 2020-08-02 – 2021-11-10 (×7): 1000 ug via INTRAMUSCULAR

## 2020-07-07 NOTE — Telephone Encounter (Signed)
I put in standing B12 order for her.

## 2020-08-02 ENCOUNTER — Other Ambulatory Visit: Payer: Self-pay

## 2020-08-02 ENCOUNTER — Ambulatory Visit (INDEPENDENT_AMBULATORY_CARE_PROVIDER_SITE_OTHER): Payer: Medicare Other

## 2020-08-02 DIAGNOSIS — E538 Deficiency of other specified B group vitamins: Secondary | ICD-10-CM

## 2020-08-02 NOTE — Progress Notes (Signed)
Pt here for monthly B12 injection per Ria Clock FNP  B12 given IM left deltoid and pt tolerated injection well.  Next B12 injection scheduled for 09/02/20.

## 2020-08-24 ENCOUNTER — Other Ambulatory Visit: Payer: Self-pay | Admitting: Family

## 2020-08-24 DIAGNOSIS — E785 Hyperlipidemia, unspecified: Secondary | ICD-10-CM

## 2020-09-02 ENCOUNTER — Ambulatory Visit: Payer: Medicare Other

## 2020-09-04 ENCOUNTER — Ambulatory Visit: Payer: Medicare Other

## 2020-09-05 ENCOUNTER — Ambulatory Visit (INDEPENDENT_AMBULATORY_CARE_PROVIDER_SITE_OTHER): Payer: Medicare Other

## 2020-09-05 ENCOUNTER — Other Ambulatory Visit: Payer: Self-pay

## 2020-09-05 DIAGNOSIS — E538 Deficiency of other specified B group vitamins: Secondary | ICD-10-CM

## 2020-09-05 NOTE — Progress Notes (Signed)
Pt here for monthly B12 injection per  Pollie Friar FNP.  B12 given IM right deltoid and pt tolerated injection well.  Next B12 injection scheduled for 10/07/20.  Dr Okey Dupre: Please cosign since PCP is out of office.

## 2020-10-07 ENCOUNTER — Other Ambulatory Visit: Payer: Self-pay

## 2020-10-07 ENCOUNTER — Ambulatory Visit (INDEPENDENT_AMBULATORY_CARE_PROVIDER_SITE_OTHER): Payer: Medicare Other

## 2020-10-07 DIAGNOSIS — E538 Deficiency of other specified B group vitamins: Secondary | ICD-10-CM

## 2020-10-07 NOTE — Progress Notes (Signed)
Pt here for monthly B12 injection per Dr. Dayton Scrape  B12 given IM,  Given in left deltoid and pt tolerated injection well.  Next B12 injection scheduled for 11/06/20.

## 2020-10-10 ENCOUNTER — Telehealth (INDEPENDENT_AMBULATORY_CARE_PROVIDER_SITE_OTHER): Payer: Medicare Other | Admitting: Family Medicine

## 2020-10-10 DIAGNOSIS — R067 Sneezing: Secondary | ICD-10-CM | POA: Diagnosis not present

## 2020-10-10 DIAGNOSIS — R059 Cough, unspecified: Secondary | ICD-10-CM

## 2020-10-10 DIAGNOSIS — R0981 Nasal congestion: Secondary | ICD-10-CM | POA: Diagnosis not present

## 2020-10-10 MED ORDER — BENZONATATE 100 MG PO CAPS: 100.0000 mg | ORAL_CAPSULE | Freq: Three times a day (TID) | ORAL | 0 refills | Status: DC | PRN

## 2020-10-10 NOTE — Patient Instructions (Signed)
  HOME CARE TIPS:  -[place] COVID19 testing information: [email or web address] [place] [phone] Most pharmacies also offer testing and home test kits. -Schedule prompt follow-up video visit with your primary care office or with [facility] if your Covid test is positive, so that you can discuss treatment.  Treatment is best given in the first 5 days of illness.  -I sent the medication(s) we discussed to your pharmacy: Meds ordered this encounter  Medications  . benzonatate (TESSALON PERLES) 100 MG capsule    Sig: Take 1 capsule (100 mg total) by mouth 3 (three) times daily as needed.    Dispense:  20 capsule    Refill:  0    -Restart your Flonase and use 2 sprays each nostril daily for the first month.  Then go to 1 spray each nostril daily.  -Use her albuterol as needed per instructions.  -can use nasal saline a few times per day if you have nasal congestion  -stay hydrated, drink plenty of fluids and eat small healthy meals - avoid dairy  -can take 1000 IU ( ) Vit D3 and 100-500 mg of Vit C daily per instructions   -follow up with your doctor in 2-3 days unless improving and feeling better  -stay home while sick, except to seek medical care, and if you have COVID19 ideally it would be best to stay home for a full 10 days since the onset of symptoms PLUS one day of no fever and feeling better. Wear a good mask (such as N95 or KN95) if around others to reduce the risk of transmission.  It was nice to meet you today, and I really hope you are feeling better soon. I help  out with telemedicine visits on Tuesdays and Thursdays and am available for visits on those days. If you have any concerns or questions following this visit please schedule a follow up visit with your Primary Care doctor or seek care at a local urgent care clinic to avoid delays in care.    Seek in person care or schedule a follow up video visit promptly if your  symptoms worsen, new concerns arise or you are not improving with treatment. Call 911 and/or seek emergency care if your symptoms are severe or life threatening.

## 2020-10-10 NOTE — Progress Notes (Signed)
Virtual Visit via Telephone Note  I connected with Dorothy Ritter on 10/10/20 at 10:00 AM EDT by telephone and verified that I am speaking with the correct person using two identifiers.   I discussed the limitations, risks, security and privacy concerns of performing an evaluation and management service by telephone and the availability of in person appointments. I also discussed with the patient that there may be a patient responsible charge related to this service. The patient expressed understanding and agreed to proceed.  Location patient: home, [place] Location provider: work or home office Participants present for the call: patient, provider Patient did not have a visit with me in the prior 7 days to address this/these issue(s).   History of Present Illness:  Acute telemedicine visit for nasal congestion, cough: -Onset: 2 days ago -Symptoms include: runny nose, pnd, scratchy throat, cough, sneezing, eyes itchy -Denies: fevers, SOB, CP, NVD, body aches, inability to eat/drink/get out of bed, known sick contacts -Has tried: juice -Pertinent past medical history: seasonal allergies, occ needs an inhaler if sick with a cold or allergies - has not required this illness -Pertinent medication allergies: nkda -COVID-19 vaccine status: had covid vaccines and booster and flu vaccine   Observations/Objective: Patient sounds cheerful and well on the phone. I do not appreciate any SOB. Speech and thought processing are grossly intact. Patient reported vitals:  Assessment and Plan:  Nasal congestion  Cough  Sneezing  -we discussed possible serious and likely etiologies, options for evaluation and workup, limitations of telemedicine visit vs in person visit, treatment, treatment risks and precautions. Pt prefers to treat via telemedicine empirically rather than in person at this moment.  Query allergic rhinitis, viral upper respiratory illness versus other.  Discussed possibility of Covid  and she plans to do home Covid test and repeat the test in a day or 2.  Opted to treat with restarting her Flonase, which she has not been taking, Tessalon for cough and other symptomatic care measures summarized in patient instructions.  Potential complications and precautions discussed. Scheduled follow up with PCP offered: She agrees to schedule follow-up if needed, through her primary care office. Advised to seek prompt in person care if worsening, new symptoms arise, or if is not improving with treatment. Advised of options for inperson care in case PCP office not available. Did let the patient know that I only do telemedicine shifts for [place] on Tuesdays and Thursdays and advised a follow up visit with PCP or at an [facility] if has further questions or concerns.   Follow Up Instructions:  I did not refer this patient for an OV with me in the next 24 hours for this/these issue(s).  I discussed the assessment and treatment plan with the patient. The patient was provided an opportunity to ask questions and all were answered. The patient agreed with the plan and demonstrated an understanding of the instructions.   I spent 15 minutes on the date of this visit in the care of this patient. See summary of tasks completed to properly care for this patient in the detailed notes above which also included counseling of above, review of PMH, medications, allergies, evaluation of the patient and ordering and/or  instructing patient on testing and care options.     Terressa Koyanagi, DO

## 2020-11-05 ENCOUNTER — Other Ambulatory Visit: Payer: Self-pay

## 2020-11-06 ENCOUNTER — Ambulatory Visit (INDEPENDENT_AMBULATORY_CARE_PROVIDER_SITE_OTHER): Payer: Medicare Other

## 2020-11-06 ENCOUNTER — Ambulatory Visit: Payer: Medicare Other

## 2020-11-06 DIAGNOSIS — E538 Deficiency of other specified B group vitamins: Secondary | ICD-10-CM

## 2020-11-06 NOTE — Progress Notes (Signed)
Pt here for monthly B12 injection per Ria Clock.   Pt will have her next B12 injection at [facility] in [facility].  B12 given left deltoid IM, and pt tolerated injection well.

## 2020-12-31 ENCOUNTER — Ambulatory Visit: Payer: Medicare Other

## 2021-01-02 ENCOUNTER — Ambulatory Visit (INDEPENDENT_AMBULATORY_CARE_PROVIDER_SITE_OTHER): Payer: Medicare Other

## 2021-01-02 ENCOUNTER — Other Ambulatory Visit: Payer: Self-pay

## 2021-01-02 DIAGNOSIS — E538 Deficiency of other specified B group vitamins: Secondary | ICD-10-CM | POA: Diagnosis not present

## 2021-01-02 MED ORDER — CYANOCOBALAMIN 1000 MCG/ML IJ SOLN
1000.0000 ug | Freq: Once | INTRAMUSCULAR | Status: AC
  Administered 2021-01-02: 1000 ug via INTRAMUSCULAR

## 2021-01-02 MED ORDER — CYANOCOBALAMIN 1000 MCG/ML IJ SOLN: 1000.0000 ug | Freq: Once | INTRAMUSCULAR | Status: DC

## 2021-01-02 NOTE — Progress Notes (Signed)
Dorothy Ritter is a 75 y.o. female presents to the office today for b12 injection, per physician's orders. Original order: pt has been taking for several years Cyanocobalamin (med), 1,000 mcg (dose),  IM (route) was administered left deltoid (location) today. Patient tolerated injection. Patient next injection due: one month, appt made Yes  Jones Bales

## 2021-02-06 ENCOUNTER — Ambulatory Visit (INDEPENDENT_AMBULATORY_CARE_PROVIDER_SITE_OTHER): Payer: Medicare Other

## 2021-02-06 ENCOUNTER — Other Ambulatory Visit: Payer: Self-pay

## 2021-02-06 DIAGNOSIS — E538 Deficiency of other specified B group vitamins: Secondary | ICD-10-CM

## 2021-02-06 NOTE — Progress Notes (Signed)
Dorothy Ritter is a 75 y.o. female presents to the office today for b12 injection, per physician's orders.  cyanocobalamin (med), 1,000 mcg (dose),  IM (route) was administered right deltoid (location) today. Patient tolerated injection. Patient next injection due: next month, appt made Yes  Jones Bales

## 2021-02-20 ENCOUNTER — Other Ambulatory Visit: Payer: Self-pay | Admitting: Family

## 2021-02-20 ENCOUNTER — Telehealth: Payer: Self-pay | Admitting: Family

## 2021-02-20 NOTE — Telephone Encounter (Signed)
She is due for her yearly CPE; can we get it combined with her upcoming B12 shot? I can refill the Tramadol for her once we get appointment updated.

## 2021-02-20 NOTE — Telephone Encounter (Signed)
Requesting: tramadol Contract:  UDS: Last Visit: 04/23/20 Next Visit:  Last Refill: 02/28/20  Please Advise

## 2021-02-21 NOTE — Telephone Encounter (Signed)
I have attempted to call pt and there was no answer so I left a message to call back.  

## 2021-03-13 ENCOUNTER — Other Ambulatory Visit: Payer: Self-pay

## 2021-03-13 ENCOUNTER — Ambulatory Visit (INDEPENDENT_AMBULATORY_CARE_PROVIDER_SITE_OTHER): Payer: Medicare Other

## 2021-03-13 DIAGNOSIS — E538 Deficiency of other specified B group vitamins: Secondary | ICD-10-CM

## 2021-03-13 MED ORDER — CYANOCOBALAMIN 1000 MCG/ML IJ SOLN
1000.0000 ug | Freq: Once | INTRAMUSCULAR | Status: AC
  Administered 2021-03-13: 1000 ug via INTRAMUSCULAR

## 2021-03-13 NOTE — Progress Notes (Addendum)
Dorothy Ritter is a 75 y.o. female presents to the office today for b12 injection, per physician's orders.   cyanocobalamin (med), 1,000 mcg (dose),  IM (route) was administered right deltoid (location) today. Patient tolerated injection. Patient next injection due: next month, appt made Yes   [name]

## 2021-03-13 NOTE — Progress Notes (Deleted)
Dorothy Ritter is a 75 y.o. female presents to the office today for b12 injection, per physician's orders.   cyanocobalamin (med), 1,000 mcg (dose),  IM (route) was administered right deltoid (location) today. Patient tolerated injection. Patient next injection due: next month, appt made Yes   Tanashia Ciesla   

## 2021-03-25 ENCOUNTER — Other Ambulatory Visit: Payer: Self-pay | Admitting: Family

## 2021-03-25 DIAGNOSIS — I10 Essential (primary) hypertension: Secondary | ICD-10-CM

## 2021-03-25 DIAGNOSIS — R609 Edema, unspecified: Secondary | ICD-10-CM

## 2021-03-25 NOTE — Telephone Encounter (Signed)
I have called pt to ask her how she wanted to continue her care. There was no answer so I left a message to call back.

## 2021-03-26 ENCOUNTER — Telehealth: Payer: Self-pay | Admitting: Family

## 2021-03-26 NOTE — Telephone Encounter (Signed)
Patient will be coming here.  Scheduled cpe for 05/06/21.  Next b12 scheduled for 04/11/21.

## 2021-03-26 NOTE — Telephone Encounter (Signed)
I think she is planning to stay with me at [facility] since she has been coming to get B12 shots but can we clarify this please. She is due for CPE- please schedule her OV.

## 2021-04-11 ENCOUNTER — Other Ambulatory Visit: Payer: Self-pay

## 2021-04-11 ENCOUNTER — Ambulatory Visit (INDEPENDENT_AMBULATORY_CARE_PROVIDER_SITE_OTHER): Payer: Medicare Other

## 2021-04-11 DIAGNOSIS — Z23 Encounter for immunization: Secondary | ICD-10-CM

## 2021-04-11 DIAGNOSIS — E538 Deficiency of other specified B group vitamins: Secondary | ICD-10-CM

## 2021-04-11 MED ORDER — CYANOCOBALAMIN 1000 MCG/ML IJ SOLN
1000.0000 ug | Freq: Once | INTRAMUSCULAR | Status: AC
  Administered 2021-04-11: 1000 ug via INTRAMUSCULAR

## 2021-04-11 NOTE — Progress Notes (Signed)
Pt is here today for B12 injection and flu vaccine. Pt was given B12 in left deltoid and flu vaccine in right deltoid. Pt tolerated both well

## 2021-04-12 ENCOUNTER — Other Ambulatory Visit: Payer: Self-pay | Admitting: Internal Medicine

## 2021-04-12 DIAGNOSIS — I10 Essential (primary) hypertension: Secondary | ICD-10-CM

## 2021-04-12 DIAGNOSIS — R609 Edema, unspecified: Secondary | ICD-10-CM

## 2021-04-29 ENCOUNTER — Other Ambulatory Visit: Payer: Self-pay | Admitting: Family

## 2021-04-29 ENCOUNTER — Telehealth: Payer: Self-pay | Admitting: Family

## 2021-04-29 ENCOUNTER — Telehealth: Payer: Self-pay

## 2021-04-29 MED ORDER — TRAMADOL HCL 50 MG PO TABS: ORAL_TABLET | ORAL | 0 refills | Status: DC

## 2021-04-29 NOTE — Telephone Encounter (Signed)
Medication:  traMADol (ULTRAM) 50 MG tablet    Has the patient contacted their pharmacy? Yes.   (If no, request that the patient contact the pharmacy for the refill.) (If yes, when and what did the pharmacy advise?) Advised pt to have pcp send in refill request   Preferred Pharmacy (with phone number or street name): [facility] [phone] [place], [place] [phone] [address] AT [facility] OF [address]  [address] [place] [address]  Phone:  [phone]  Fax:  [phone]

## 2021-04-29 NOTE — Telephone Encounter (Signed)
Patient Name: Dorothy Ritter Gender: Female DOB: May 11, 1946 Client [facility] Night - Client Client Site [facility] - Night Physician Ria Clock- NP Contact Type Call Who Is Calling Patient / Member / Family / Caregiver Call Type Triage / Clinical Relationship To Patient Self Return Phone Number [phone] (Primary) Chief Complaint Prescription Refill or Medication Request (non symptomatic) Reason for Call Medication Question / Request Initial Comment caller states she's out of refills at the pharmacy for her [facility] and needing a new script sent to pharmacy Translation No Nurse Assessment Nurse: Anner Crete, RN, Megan Date/Time (Eastern Time): 04/28/2021 8:24:22 PM Please select the assessment type ---Refill Additional Documentation ---needing refill on Tramadol Does the patient have enough medication to last until the office opens? ---No Additional Documentation ---caller reports not sure of mg or information, caller reports she takes the medication PRN. Disp. Time Lamount Cohen Time) Disposition Final User 04/28/2021 6:22:50 PM Send To Nurse Laurence Slate, RN, Rhonda 04/28/2021 6:43:44 PM Attempt made - message left Genevive Bi, RN, Deb 04/28/2021 6:44:02 PM Send To RN Personal Genevive Bi, RN, Deb 04/28/2021 6:50:50 PM FINAL ATTEMPT MADE - message left Genevive Bi RN, Deb 04/28/2021 6:50:58 PM Send to RN Final Attempt Delrae Rend, RN, Deb 04/28/2021 8:27:52 PM Clinical Call Yes Anner Crete, RN, Virgel Gess Understands Yes

## 2021-05-05 ENCOUNTER — Other Ambulatory Visit: Payer: Self-pay | Admitting: Family

## 2021-05-05 DIAGNOSIS — J209 Acute bronchitis, unspecified: Secondary | ICD-10-CM

## 2021-05-06 ENCOUNTER — Encounter: Payer: Medicare Other | Admitting: Family

## 2021-05-13 ENCOUNTER — Ambulatory Visit: Payer: Medicare Other

## 2021-05-15 ENCOUNTER — Telehealth: Payer: Self-pay | Admitting: Family

## 2021-05-15 ENCOUNTER — Encounter: Payer: Medicare Other | Admitting: Family

## 2021-05-15 NOTE — Telephone Encounter (Signed)
Pt stated went to pharmacy to get inhaler albuterol (VENTOLIN HFA) 108 (90 Base) MCG/ACT inhaler (meds was sent on 10-31) at pharmacy they mentioned that pt had to pay a large amount since insurance does not cover inhaler completely- pt would like to know if she can another inhaler that her insurance will cover more. Ok to send to :  [facility] [phone] [place], [place] - [phone] [facility] [address] AT [facility] OF [address]  [address] [place] [address]  Phone:  [phone]  Fax:  [phone]  DEA #:  [id number]  Please advise.

## 2021-05-16 NOTE — Telephone Encounter (Signed)
I have called the pharmacy and we have gotten the medication of Ventolin to be covered for $52.06 for the 3 month supply. The pro air is not covered at all. The insurance is being finicky.   I have attempted to call the pt twice to ask her if that is doable, however the phone has gone to the busy signal twice.

## 2021-05-20 NOTE — Telephone Encounter (Signed)
I have called pt and left a message to call back.   Fyi:

## 2021-05-27 ENCOUNTER — Encounter: Payer: Self-pay | Admitting: Family

## 2021-05-27 ENCOUNTER — Ambulatory Visit (INDEPENDENT_AMBULATORY_CARE_PROVIDER_SITE_OTHER): Payer: Medicare Other | Admitting: Family

## 2021-05-27 ENCOUNTER — Other Ambulatory Visit: Payer: Self-pay

## 2021-05-27 VITALS — BP 160/90 | HR 63 | Temp 97.9°F | Ht 65.0 in | Wt 230.8 lb

## 2021-05-27 DIAGNOSIS — E039 Hypothyroidism, unspecified: Secondary | ICD-10-CM

## 2021-05-27 DIAGNOSIS — Z1231 Encounter for screening mammogram for malignant neoplasm of breast: Secondary | ICD-10-CM

## 2021-05-27 DIAGNOSIS — E559 Vitamin D deficiency, unspecified: Secondary | ICD-10-CM | POA: Diagnosis not present

## 2021-05-27 DIAGNOSIS — I10 Essential (primary) hypertension: Secondary | ICD-10-CM

## 2021-05-27 DIAGNOSIS — M8580 Other specified disorders of bone density and structure, unspecified site: Secondary | ICD-10-CM

## 2021-05-27 DIAGNOSIS — E538 Deficiency of other specified B group vitamins: Secondary | ICD-10-CM | POA: Diagnosis not present

## 2021-05-27 DIAGNOSIS — Z0000 Encounter for general adult medical examination without abnormal findings: Secondary | ICD-10-CM | POA: Diagnosis not present

## 2021-05-27 DIAGNOSIS — Z13220 Encounter for screening for lipoid disorders: Secondary | ICD-10-CM

## 2021-05-27 MED ORDER — CYANOCOBALAMIN 1000 MCG/ML IJ SOLN
1000.0000 ug | Freq: Once | INTRAMUSCULAR | Status: AC
  Administered 2021-05-27: 1000 ug via INTRAMUSCULAR

## 2021-05-27 MED ORDER — AMLODIPINE BESYLATE 10 MG PO TABS: 10.0000 mg | ORAL_TABLET | Freq: Every day | ORAL | 0 refills | Status: DC

## 2021-05-27 NOTE — Progress Notes (Signed)
Pt tolerated B12 well 

## 2021-05-27 NOTE — Patient Instructions (Signed)
Please start taking 10 mg of Amlodipine daily along with your Quinapril; let's have you follow up in 1 month to re-check your blood pressure and update your B12 shot.   [facility] They will call you to schedule.
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
## 2021-05-28 LAB — CBC WITH DIFFERENTIAL/PLATELET
Basophils Absolute: 0 10*3/uL (ref 0.0–0.1)
Basophils Relative: 0.5 % (ref 0.0–3.0)
Eosinophils Absolute: 0.3 10*3/uL (ref 0.0–0.7)
Eosinophils Relative: 5 % (ref 0.0–5.0)
HCT: 39 % (ref 36.0–46.0)
Hemoglobin: 12.8 g/dL (ref 12.0–15.0)
Lymphocytes Relative: 42.3 % (ref 12.0–46.0)
Lymphs Abs: 2.5 10*3/uL (ref 0.7–4.0)
MCHC: 32.8 g/dL (ref 30.0–36.0)
MCV: 97.3 fl (ref 78.0–100.0)
Monocytes Absolute: 0.5 10*3/uL (ref 0.1–1.0)
Monocytes Relative: 8.7 % (ref 3.0–12.0)
Neutro Abs: 2.6 10*3/uL (ref 1.4–7.7)
Neutrophils Relative %: 43.5 % (ref 43.0–77.0)
Platelets: 306 10*3/uL (ref 150.0–400.0)
RBC: 4.01 Mil/uL (ref 3.87–5.11)
RDW: 14.1 % (ref 11.5–15.5)
WBC: 5.9 10*3/uL (ref 4.0–10.5)

## 2021-05-28 LAB — COMPREHENSIVE METABOLIC PANEL
ALT: 11 U/L (ref 0–35)
AST: 15 U/L (ref 0–37)
Albumin: 3.8 g/dL (ref 3.5–5.2)
Alkaline Phosphatase: 82 U/L (ref 39–117)
BUN: 11 mg/dL (ref 6–23)
CO2: 30 mEq/L (ref 19–32)
Calcium: 8.5 mg/dL (ref 8.4–10.5)
Chloride: 103 mEq/L (ref 96–112)
Creatinine, Ser: 0.75 mg/dL (ref 0.40–1.20)
GFR: 77.32 mL/min (ref >60.00)
Glucose, Bld: 83 mg/dL (ref 70–99)
Potassium: 3.9 mEq/L (ref 3.5–5.1)
Sodium: 139 mEq/L (ref 135–145)
Total Bilirubin: 0.4 mg/dL (ref 0.2–1.2)
Total Protein: 7 g/dL (ref 6.0–8.3)

## 2021-05-28 LAB — VITAMIN B12: Vitamin B-12: >1550 pg/mL — ABNORMAL HIGH (ref 211–911)

## 2021-05-28 LAB — TSH: TSH: 8.09 u[IU]/mL — ABNORMAL HIGH (ref 0.35–5.50)

## 2021-05-28 LAB — LIPID PANEL
Cholesterol: 198 mg/dL (ref 0–200)
HDL: 68.6 mg/dL (ref >39.00)
LDL Cholesterol: 114 mg/dL — ABNORMAL HIGH (ref 0–99)
NonHDL: 129.11
Total CHOL/HDL Ratio: 3
Triglycerides: 74 mg/dL (ref 0.0–149.0)
VLDL: 14.8 mg/dL (ref 0.0–40.0)

## 2021-05-28 LAB — VITAMIN D 25 HYDROXY (VIT D DEFICIENCY, FRACTURES): VITD: 13.09 ng/mL — ABNORMAL LOW (ref 30.00–100.00)

## 2021-06-02 ENCOUNTER — Telehealth: Payer: Self-pay | Admitting: *Deleted

## 2021-06-02 MED ORDER — VITAMIN D (ERGOCALCIFEROL) 1.25 MG (50000 UNIT) PO CAPS: 50000.0000 [IU] | ORAL_CAPSULE | ORAL | 0 refills | Status: DC

## 2021-06-02 NOTE — Telephone Encounter (Signed)
-----   Message from Olive Bass, FNP sent at 06/02/2021  8:29 AM EST ----- Please see if she has missed any of her thyroid medication. If not, need to increase the dosage of Synthroid. Will also need to start Vitamin D Rx for 12 weeks;

## 2021-06-02 NOTE — Telephone Encounter (Signed)
Patient stated she only missed a weekend because pharmacy did not have it in stock otherwise she takes it everyday.  Advised that we will be sending in rxs to pharmacy.  What dose of Synthroid you would like to send in.  Rx sent in for vit d for you.

## 2021-06-03 ENCOUNTER — Other Ambulatory Visit: Payer: Self-pay | Admitting: Family

## 2021-06-03 DIAGNOSIS — E039 Hypothyroidism, unspecified: Secondary | ICD-10-CM

## 2021-06-03 MED ORDER — LEVOTHYROXINE SODIUM 88 MCG PO TABS: 88.0000 ug | ORAL_TABLET | Freq: Every day | ORAL | 0 refills | Status: DC

## 2021-06-03 NOTE — Telephone Encounter (Signed)
I have called the pt and there was no answer so I left a message to call back.  ?

## 2021-06-04 NOTE — Telephone Encounter (Signed)
Left detailed message on machine.

## 2021-06-19 ENCOUNTER — Other Ambulatory Visit: Payer: Self-pay | Admitting: Family

## 2021-06-19 DIAGNOSIS — I10 Essential (primary) hypertension: Secondary | ICD-10-CM

## 2021-06-19 DIAGNOSIS — R609 Edema, unspecified: Secondary | ICD-10-CM

## 2021-06-26 ENCOUNTER — Encounter: Payer: Self-pay | Admitting: Family

## 2021-06-26 ENCOUNTER — Telehealth (INDEPENDENT_AMBULATORY_CARE_PROVIDER_SITE_OTHER): Payer: Medicare Other | Admitting: Family

## 2021-06-26 VITALS — Ht 65.0 in

## 2021-06-26 DIAGNOSIS — E039 Hypothyroidism, unspecified: Secondary | ICD-10-CM

## 2021-06-26 DIAGNOSIS — I10 Essential (primary) hypertension: Secondary | ICD-10-CM | POA: Diagnosis not present
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
## 2021-08-06 ENCOUNTER — Ambulatory Visit: Payer: Medicare Other

## 2021-08-08 ENCOUNTER — Ambulatory Visit (INDEPENDENT_AMBULATORY_CARE_PROVIDER_SITE_OTHER): Payer: Medicare Other

## 2021-08-08 DIAGNOSIS — E538 Deficiency of other specified B group vitamins: Secondary | ICD-10-CM | POA: Diagnosis not present

## 2021-08-08 MED ORDER — CYANOCOBALAMIN 1000 MCG/ML IJ SOLN
1000.0000 ug | Freq: Once | INTRAMUSCULAR | Status: AC
  Administered 2021-08-08: 1000 ug via INTRAMUSCULAR

## 2021-08-08 NOTE — Progress Notes (Signed)
Dorothy Ritter is a 76 y.o. female presents to the office today for b12 injection, per physician's orders.   cyanocobalamin (med), 1,000 mcg (dose),  IM (route) was administered left deltoid (location) today. Patient tolerated injection. Patient next injection due: next month, appt made

## 2021-08-23 ENCOUNTER — Other Ambulatory Visit: Payer: Self-pay | Admitting: Family

## 2021-08-23 DIAGNOSIS — I10 Essential (primary) hypertension: Secondary | ICD-10-CM

## 2021-08-26 ENCOUNTER — Encounter: Payer: Self-pay | Admitting: Family

## 2021-08-26 ENCOUNTER — Telehealth (INDEPENDENT_AMBULATORY_CARE_PROVIDER_SITE_OTHER): Payer: Medicare Other | Admitting: Family

## 2021-08-26 DIAGNOSIS — J029 Acute pharyngitis, unspecified: Secondary | ICD-10-CM | POA: Diagnosis not present

## 2021-08-26 DIAGNOSIS — E039 Hypothyroidism, unspecified: Secondary | ICD-10-CM | POA: Diagnosis not present

## 2021-08-26 DIAGNOSIS — I10 Essential (primary) hypertension: Secondary | ICD-10-CM

## 2021-08-26 DIAGNOSIS — J209 Acute bronchitis, unspecified: Secondary | ICD-10-CM | POA: Diagnosis not present

## 2021-08-26 MED ORDER — FLUTICASONE PROPIONATE 50 MCG/ACT NA SUSP: 2.0000 | Freq: Every day | NASAL | 6 refills | Status: DC

## 2021-08-26 MED ORDER — AZITHROMYCIN 250 MG PO TABS: ORAL_TABLET | ORAL | 0 refills | Status: DC
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
## 2021-09-05 ENCOUNTER — Ambulatory Visit (INDEPENDENT_AMBULATORY_CARE_PROVIDER_SITE_OTHER): Payer: Medicare Other

## 2021-09-05 DIAGNOSIS — E538 Deficiency of other specified B group vitamins: Secondary | ICD-10-CM

## 2021-09-05 NOTE — Progress Notes (Signed)
Dorothy Ritter is a 76 y.o. female  presents to the office today for a B12 injection, per physician's orders. ?Original order: per Olive Bass, FNP ? ?cyanocobalamin, 1000 mg/ml IM was administered in right deltoid today.  ? ?Patient tolerated injection well.  ? ?Next appointment:  10/09/21, 0945  ?

## 2021-09-09 ENCOUNTER — Other Ambulatory Visit: Payer: Self-pay | Admitting: Family

## 2021-09-09 DIAGNOSIS — E039 Hypothyroidism, unspecified: Secondary | ICD-10-CM

## 2021-09-11 ENCOUNTER — Other Ambulatory Visit: Payer: Self-pay | Admitting: Family

## 2021-09-11 DIAGNOSIS — I10 Essential (primary) hypertension: Secondary | ICD-10-CM

## 2021-09-12 ENCOUNTER — Telehealth: Payer: Self-pay | Admitting: Family

## 2021-09-12 DIAGNOSIS — E039 Hypothyroidism, unspecified: Secondary | ICD-10-CM

## 2021-09-12 NOTE — Telephone Encounter (Signed)
lw 

## 2021-09-12 NOTE — Telephone Encounter (Signed)
I have called pt and relayed the message to call us back. She is to keep her 10/09/21 nurse visit since it is going to be a BP check and B12 shot along with a lab visit afterwards to check her TSH.  ?

## 2021-09-12 NOTE — Telephone Encounter (Signed)
With her next nurse visit for B12, can you add notice to also get a blood pressure check? I would also like lab visit for same time as the nurse visit  to get her thyroid level re-checked. The lab orders are in place.  ?

## 2021-09-12 NOTE — Telephone Encounter (Signed)
I have called pt to let her know that she does have a nurse visit appt on 10/09/21 and we have added a BP check and lab draw for that day. There was no answer so I left a message on her answering service.  ?

## 2021-09-17 ENCOUNTER — Telehealth: Payer: Self-pay | Admitting: Family

## 2021-09-17 NOTE — Telephone Encounter (Signed)
Pt states she has not been able to get quinapril, as the pharmacy has been on back order. She stated she was advised to go to the [facility] lab, but already has a lab appt here in April. Please advise pt if she should wait till April to run labs or if rx can be sent in somewhere else so she can do labs at [facility].  ?

## 2021-09-18 NOTE — Telephone Encounter (Signed)
I have called the to let her know that she can wait to get her labs drawn on April 6th, the day of her B12 shot. On that day we have added a BP check. There was no answer so I left a message to call back. Also what pharmacy does she want her medication to be sent to? ?

## 2021-09-26 ENCOUNTER — Telehealth: Payer: Self-pay | Admitting: Family

## 2021-09-26 NOTE — Telephone Encounter (Signed)
I attempted to call and schedule Medicare Annual Wellness Visit (AWV) in office. Patient hung up. ? ?If not able to come in office, please offer to do virtually or by telephone.  Left office number and my jabber [phone]. ? ?Last AWV:08/24/2018 ? ?Please schedule at anytime with Nurse Health Advisor. ?  ?

## 2021-10-01 ENCOUNTER — Telehealth: Payer: Self-pay | Admitting: Family

## 2021-10-01 NOTE — Telephone Encounter (Signed)
Pt stated pharmacy is out of quinapril. She has been waiting over two weeks for rx and legs and feet are starting to swell. She would like to see if something else can be prescribed while she waits. Advised pt she could be transferred to a nurse if she was concerned, she declined. She stated if it gets worse she will call the office. Please advise.  ? ?[facility] [phone] - [place], [place] [phone] [address] AT [facility] OF [address]  ?[address] [place] [address]  ?Phone:  [phone]  Fax:  [phone]  ?

## 2021-10-02 ENCOUNTER — Other Ambulatory Visit: Payer: Self-pay | Admitting: Family

## 2021-10-02 MED ORDER — LISINOPRIL 40 MG PO TABS: 40.0000 mg | ORAL_TABLET | Freq: Every day | ORAL | 0 refills | Status: DC

## 2021-10-02 NOTE — Telephone Encounter (Signed)
Called pt and pharmacy and there is a Sport and exercise psychologist on the medication and they have not been able to get it in.  ? ?Please send in alternative.  ?

## 2021-10-02 NOTE — Telephone Encounter (Signed)
I have called the pt and relayed the message from provider. She stated understanding and will keep her nurse visit.  ?

## 2021-10-03 ENCOUNTER — Telehealth: Payer: Self-pay | Admitting: Family

## 2021-10-03 ENCOUNTER — Other Ambulatory Visit: Payer: Self-pay | Admitting: Family

## 2021-10-03 MED ORDER — TRAMADOL HCL 50 MG PO TABS: 50.0000 mg | ORAL_TABLET | Freq: Every day | ORAL | 0 refills | Status: DC | PRN

## 2021-10-03 NOTE — Telephone Encounter (Signed)
Pt requesting medication refill  ? ?traMADol (ULTRAM) 50 MG tablet [[id number] ?[facility] [phone] [place], [place] [phone] [address] AT [facility] OF [address]  ?[address] [place] [address]  ?Phone:  [phone]  Fax:  [phone]  ?

## 2021-10-08 ENCOUNTER — Other Ambulatory Visit: Payer: Self-pay | Admitting: Family

## 2021-10-08 DIAGNOSIS — E785 Hyperlipidemia, unspecified: Secondary | ICD-10-CM

## 2021-10-09 ENCOUNTER — Ambulatory Visit (INDEPENDENT_AMBULATORY_CARE_PROVIDER_SITE_OTHER): Payer: Medicare Other

## 2021-10-09 ENCOUNTER — Other Ambulatory Visit (INDEPENDENT_AMBULATORY_CARE_PROVIDER_SITE_OTHER): Payer: Medicare Other

## 2021-10-09 DIAGNOSIS — I10 Essential (primary) hypertension: Secondary | ICD-10-CM | POA: Diagnosis not present

## 2021-10-09 DIAGNOSIS — E538 Deficiency of other specified B group vitamins: Secondary | ICD-10-CM

## 2021-10-09 DIAGNOSIS — E039 Hypothyroidism, unspecified: Secondary | ICD-10-CM

## 2021-10-09 LAB — TSH: TSH: 5.92 u[IU]/mL — ABNORMAL HIGH (ref 0.35–5.50)

## 2021-10-09 MED ORDER — CYANOCOBALAMIN 1000 MCG/ML IJ SOLN
1000.0000 ug | Freq: Once | INTRAMUSCULAR | Status: AC
  Administered 2021-10-09: 1000 ug via INTRAMUSCULAR

## 2021-10-09 NOTE — Progress Notes (Signed)
Dorothy Ritter is a 76 y.o. female presents to the office today for a B12 injection, per physician's orders. ?Original order: per Olive Bass, FNP ? ?cyanocobalamin, 1000 mg/ml IM was administered in right deltoid today.  ? ?Patient tolerated injection well.  ? ? ? ? ? ?Pt here for BP Check per, Vernona Rieger  ? ?Pt currently takes: amLODipine (NORVASC) 10 MG tablet TAKE 1 TABLET(10 MG  ?Lisinopril (ZESTRIL) 40MG  ?Pt reports compliance with medication. ? ?BP today @= 128/64 ? ?HR=73 ? ?Pt advise  ?

## 2021-10-13 ENCOUNTER — Other Ambulatory Visit: Payer: Self-pay | Admitting: Family

## 2021-10-13 DIAGNOSIS — E039 Hypothyroidism, unspecified: Secondary | ICD-10-CM

## 2021-10-13 MED ORDER — LEVOTHYROXINE SODIUM 100 MCG PO TABS: 100.0000 ug | ORAL_TABLET | Freq: Every day | ORAL | 0 refills | Status: DC

## 2021-11-10 ENCOUNTER — Other Ambulatory Visit: Payer: Self-pay | Admitting: Family

## 2021-11-10 ENCOUNTER — Ambulatory Visit (INDEPENDENT_AMBULATORY_CARE_PROVIDER_SITE_OTHER): Payer: Medicare Other

## 2021-11-10 DIAGNOSIS — E039 Hypothyroidism, unspecified: Secondary | ICD-10-CM

## 2021-11-10 DIAGNOSIS — E538 Deficiency of other specified B group vitamins: Secondary | ICD-10-CM | POA: Diagnosis not present

## 2021-11-10 MED ORDER — CYANOCOBALAMIN 1000 MCG/ML IJ SOLN: 1000.0000 ug | INTRAMUSCULAR | Status: DC

## 2021-11-10 NOTE — Progress Notes (Signed)
Pt here for monthly B12 injection per Vernona Rieger ? ?B12 given IM, and pt tolerated injection well. ? ?Pt will call back to schedule next B12 ? ?

## 2021-11-11 ENCOUNTER — Telehealth: Payer: Self-pay | Admitting: Family

## 2021-11-11 NOTE — Telephone Encounter (Signed)
Left message for patient to call back and schedule Medicare Annual Wellness Visit (AWV).   Please offer to do virtually or by telephone.  Left office number and my jabber #[phone].  Last AWV:08/24/2018  Please schedule at anytime with Nurse Health Advisor.   

## 2021-11-13 ENCOUNTER — Encounter: Payer: Self-pay | Admitting: Family

## 2021-11-13 ENCOUNTER — Ambulatory Visit: Payer: Medicare Other | Admitting: Family

## 2021-11-13 VITALS — BP 130/70 | HR 70 | Temp 97.8°F | Ht 65.0 in | Wt 229.6 lb

## 2021-11-13 DIAGNOSIS — E039 Hypothyroidism, unspecified: Secondary | ICD-10-CM

## 2021-11-13 DIAGNOSIS — R220 Localized swelling, mass and lump, head: Secondary | ICD-10-CM | POA: Diagnosis not present

## 2021-11-13 DIAGNOSIS — I10 Essential (primary) hypertension: Secondary | ICD-10-CM | POA: Diagnosis not present

## 2021-11-13 DIAGNOSIS — E559 Vitamin D deficiency, unspecified: Secondary | ICD-10-CM

## 2021-11-13 LAB — TSH: TSH: 8.11 u[IU]/mL — ABNORMAL HIGH (ref 0.35–5.50)

## 2021-11-13 LAB — VITAMIN D 25 HYDROXY (VIT D DEFICIENCY, FRACTURES): VITD: 32.52 ng/mL (ref 30.00–100.00)

## 2021-11-13 MED ORDER — METHYLPREDNISOLONE ACETATE 40 MG/ML IJ SUSP
40.0000 mg | Freq: Once | INTRAMUSCULAR | Status: AC
  Administered 2021-11-13: 40 mg via INTRAMUSCULAR
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
## 2021-11-13 NOTE — Patient Instructions (Signed)
We are going to add Lisinopril as an allergy;for now, stay on just the Amlodipine 10 mg daily; have your daughter check your pressure on Sunday and let me know what your readings are like on Monday;  ?

## 2021-11-17 ENCOUNTER — Ambulatory Visit (INDEPENDENT_AMBULATORY_CARE_PROVIDER_SITE_OTHER): Payer: Medicare Other | Admitting: Family Medicine

## 2021-11-17 ENCOUNTER — Encounter: Payer: Self-pay | Admitting: Family Medicine

## 2021-11-17 VITALS — BP 120/71 | HR 70 | Ht 65.0 in | Wt 227.2 lb

## 2021-11-17 DIAGNOSIS — E039 Hypothyroidism, unspecified: Secondary | ICD-10-CM | POA: Diagnosis not present

## 2021-11-17 DIAGNOSIS — K146 Glossodynia: Secondary | ICD-10-CM

## 2021-11-17 MED ORDER — LEVOTHYROXINE SODIUM 112 MCG PO TABS: 112.0000 ug | ORAL_TABLET | Freq: Every day | ORAL | 2 refills | Status: DC

## 2021-11-17 NOTE — Patient Instructions (Signed)
Adding ultrasound of neck and adjusting thyroid medications.  ?We will let you know results and any changes to plan.  ?Continue monitoring your symptoms - if symptoms return or worsen please let us know. ?Glad your symptoms have fully resolved now. You can try warm salt water rinses and stimulating your salivary glands by sucking on something sour.  ? ?Please contact office for follow-up if symptoms do not improve or worsen. Seek emergency care if symptoms become severe. ? ?

## 2021-11-17 NOTE — Progress Notes (Signed)
? ?Acute Office Visit ? ?Subjective:  ? ?  ?Patient ID: Dorothy Ritter, female    DOB: 1946-03-05, 76 y.o.   MRN: [id number] ? ?CC: swollen area under tongue  ? ? ?HPI ?Patient is in today for swollen area under her tongue.   ? ?Patient came in to see PCP on 11/13/21 with complaints of lip swelling and throat irritation after eating buttered popcorn. She had also recently started on lisinopril. Symptoms had mostly resolved by the time she arrived, but her lisinopril was stopped in case of allergic reaction. She was give a depo-medrol injection in the office. Lab revealed elevated TSH - PCP wanted to verify patient was taking levothyroxine as prescribed and order a neck US (previous thyroidectomy d/t goiter), but patient did not answer call.  ? ?Today patient reports she woke up this morning around 3am and felt a small knot under the left side of her tongue along with some throat irritation that she has trouble describing. She denies any pain, itching, difficulty breathing/swallowing, but reports swallowing just felt different and "not as smooth as normal." She drank some water and went back to sleep and symptoms resolved on their own.  ? ? ? ?ROS ?All review of systems negative except what is listed in the HPI ? ? ?   ?Objective:  ?  ?BP 120/71   Pulse 70   Ht 5\' 5"  (1.651 m)   Wt 227 lb 3.2 oz (103.1 kg)   BMI 37.81 kg/m?  ? ? ?Physical Exam ?Vitals reviewed.  ?Constitutional:   ?   General: She is not in acute distress. ?   Appearance: Normal appearance. She is not ill-appearing.  ?HENT:  ?   Head: Normocephalic and atraumatic.  ?   Salivary Glands: Right salivary gland is not diffusely enlarged or tender. Left salivary gland is not diffusely enlarged or tender.  ?   Mouth/Throat:  ?   Lips: No lesions.  ?   Mouth: Mucous membranes are moist. No oral lesions or angioedema.  ?   Dentition: No gum lesions.  ?   Tongue: No lesions.  ?   Palate: No mass and lesions.  ?   Pharynx: Oropharynx is clear. Uvula  midline. No pharyngeal swelling, oropharyngeal exudate, posterior oropharyngeal erythema or uvula swelling.  ?Pulmonary:  ?   Effort: Pulmonary effort is normal. No respiratory distress.  ?   Breath sounds: Normal breath sounds. No stridor. No wheezing.  ?Musculoskeletal:  ?   Cervical back: Normal range of motion and neck supple. No rigidity or tenderness.  ?Lymphadenopathy:  ?   Cervical: No cervical adenopathy.  ?Neurological:  ?   General: No focal deficit present.  ?   Mental Status: She is alert and oriented to person, place, and time. Mental status is at baseline.  ?Psychiatric:     ?   Mood and Affect: Mood normal.     ?   Behavior: Behavior normal.     ?   Thought Content: Thought content normal.     ?   Judgment: Judgment normal.  ? ? ?No results found for any visits on 11/17/21. ? ? ?   ?Assessment & Plan:  ? ?1. Hypothyroidism, unspecified type ?2. Tongue sore ?No visible or palpable findings in mouth, gums, tongue, palate, neck. Patient states symptoms have completely resolved. Will go ahead and order 11/19/21 per PCP result note and increase synthroid to 112 mcg daily with 6 week follow-up. Consider salivary gland irritation with spontaneous resolution -  recommended warm salt water rinses and trying sour food/candy to stimulate salivation if irritation recurs. Patient aware of signs/symptoms requiring further/urgent evaluation - strict ED precautions discussed and patient aware of signs/symptoms of allergic reaction.  ? ? ?- US SOFT TISSUE NECK; Future ?- levothyroxine (SYNTHROID) 112 MCG tablet; Take 1 tablet (112 mcg total) by mouth daily before breakfast.  Dispense: 90 tablet; Refill: 2 ? ? ?Return in about 6 weeks (around 12/29/2021) for PCP - thyroid recheck . ? ?Clayborne Dana, NP ? ? ?

## 2021-11-18 ENCOUNTER — Ambulatory Visit (HOSPITAL_BASED_OUTPATIENT_CLINIC_OR_DEPARTMENT_OTHER)
Admission: RE | Admit: 2021-11-18 | Discharge: 2021-11-18 | Disposition: A | Discharge status: Home / Self Care | Payer: Medicare Other | Source: Ambulatory Visit | Attending: Family Medicine | Admitting: Family Medicine

## 2021-11-18 DIAGNOSIS — R221 Localized swelling, mass and lump, neck: Secondary | ICD-10-CM | POA: Diagnosis not present

## 2021-11-18 DIAGNOSIS — E039 Hypothyroidism, unspecified: Secondary | ICD-10-CM | POA: Diagnosis not present

## 2021-11-19 ENCOUNTER — Encounter: Payer: Self-pay | Admitting: *Deleted

## 2021-11-21 ENCOUNTER — Other Ambulatory Visit: Payer: Self-pay | Admitting: Family

## 2021-11-21 ENCOUNTER — Ambulatory Visit
Admission: RE | Admit: 2021-11-21 | Discharge: 2021-11-21 | Disposition: A | Discharge status: Home / Self Care | Payer: Medicare Other | Source: Ambulatory Visit | Attending: Family | Admitting: Family

## 2021-11-21 DIAGNOSIS — Z1231 Encounter for screening mammogram for malignant neoplasm of breast: Secondary | ICD-10-CM

## 2021-11-21 DIAGNOSIS — M8589 Other specified disorders of bone density and structure, multiple sites: Secondary | ICD-10-CM | POA: Diagnosis not present

## 2021-11-21 DIAGNOSIS — I10 Essential (primary) hypertension: Secondary | ICD-10-CM

## 2021-11-21 DIAGNOSIS — M810 Age-related osteoporosis without current pathological fracture: Secondary | ICD-10-CM | POA: Diagnosis not present

## 2021-11-21 DIAGNOSIS — Z780 Asymptomatic menopausal state: Secondary | ICD-10-CM | POA: Diagnosis not present

## 2021-11-21 DIAGNOSIS — M8580 Other specified disorders of bone density and structure, unspecified site: Secondary | ICD-10-CM

## 2021-11-26 ENCOUNTER — Other Ambulatory Visit: Payer: Self-pay | Admitting: Family

## 2021-11-26 DIAGNOSIS — I10 Essential (primary) hypertension: Secondary | ICD-10-CM

## 2021-11-26 DIAGNOSIS — R609 Edema, unspecified: Secondary | ICD-10-CM

## 2021-11-27 ENCOUNTER — Other Ambulatory Visit: Payer: Self-pay | Admitting: Family

## 2021-11-27 ENCOUNTER — Telehealth: Payer: Self-pay

## 2021-11-27 ENCOUNTER — Other Ambulatory Visit: Payer: Self-pay

## 2021-11-27 DIAGNOSIS — R609 Edema, unspecified: Secondary | ICD-10-CM

## 2021-11-27 DIAGNOSIS — I10 Essential (primary) hypertension: Secondary | ICD-10-CM

## 2021-11-27 MED ORDER — VITAMIN D (ERGOCALCIFEROL) 1.25 MG (50000 UNIT) PO CAPS: 50000.0000 [IU] | ORAL_CAPSULE | ORAL | 0 refills | Status: DC

## 2021-11-27 MED ORDER — POTASSIUM CHLORIDE ER 8 MEQ PO TBCR: EXTENDED_RELEASE_TABLET | ORAL | 0 refills | Status: DC

## 2021-11-28 NOTE — Telephone Encounter (Signed)
done

## 2021-12-02 ENCOUNTER — Other Ambulatory Visit: Payer: Medicare Other

## 2021-12-05 ENCOUNTER — Telehealth: Payer: Self-pay | Admitting: Family

## 2021-12-05 NOTE — Telephone Encounter (Signed)
Left message for patient to call back and schedule Medicare Annual Wellness Visit (AWV).   Please offer to do virtually or by telephone.  Left office number and my jabber #[phone].  Last AWV:08/24/2018  Please schedule at anytime with Nurse Health Advisor.   

## 2021-12-08 ENCOUNTER — Telehealth: Payer: Self-pay | Admitting: *Deleted

## 2021-12-08 NOTE — Telephone Encounter (Signed)
Who Is Calling Patient / Member / Family / Caregiver Caller Name Dorothy Ritter Phone Number [phone] Call Type Message Only Information Provided Reason for Call Returning a Call from the Office Initial Comment Caller states she received a call about a wellness visit on June 8th, 2023 at 1130am. Could the office contact the caller to confirm the appointment? Additional Comment Caller is confirming a wellness visit on 12/11/2021 at 1100am. Disp. Time Disposition Final User 12/06/2021 6:35:35 PM General Information Provided Yes King-Hussey, Berdi Call Closed By: Vivianne Master Transaction Date/Time: 12/06/2021 6:30:32 PM (ET

## 2021-12-08 NOTE — Telephone Encounter (Signed)
Left detailed message that she has appt 12/11/21 at 1130 for b12 injection.

## 2021-12-11 ENCOUNTER — Ambulatory Visit (INDEPENDENT_AMBULATORY_CARE_PROVIDER_SITE_OTHER): Payer: Medicare Other

## 2021-12-11 DIAGNOSIS — E538 Deficiency of other specified B group vitamins: Secondary | ICD-10-CM | POA: Diagnosis not present

## 2021-12-11 MED ORDER — CYANOCOBALAMIN 1000 MCG/ML IJ SOLN
1000.0000 ug | Freq: Once | INTRAMUSCULAR | Status: AC
  Administered 2021-12-11: 1000 ug via INTRAMUSCULAR

## 2021-12-11 NOTE — Progress Notes (Signed)
Pt here for monthly B12 injection per Vernona Rieger  B12 given IM, and pt tolerated injection well.  Pt will call back to schedule next B12

## 2021-12-24 ENCOUNTER — Telehealth: Payer: Self-pay

## 2021-12-24 NOTE — Telephone Encounter (Signed)
error 

## 2021-12-27 ENCOUNTER — Other Ambulatory Visit: Payer: Self-pay | Admitting: Family

## 2021-12-30 ENCOUNTER — Ambulatory Visit (INDEPENDENT_AMBULATORY_CARE_PROVIDER_SITE_OTHER): Payer: Medicare Other | Admitting: Family

## 2021-12-30 VITALS — BP 130/74 | HR 92 | Temp 97.5°F | Resp 16 | Ht 65.0 in | Wt 223.0 lb

## 2021-12-30 DIAGNOSIS — M5416 Radiculopathy, lumbar region: Secondary | ICD-10-CM | POA: Diagnosis not present

## 2021-12-30 DIAGNOSIS — E538 Deficiency of other specified B group vitamins: Secondary | ICD-10-CM | POA: Diagnosis not present

## 2021-12-30 DIAGNOSIS — E039 Hypothyroidism, unspecified: Secondary | ICD-10-CM | POA: Diagnosis not present

## 2021-12-30 DIAGNOSIS — I10 Essential (primary) hypertension: Secondary | ICD-10-CM

## 2021-12-30 DIAGNOSIS — M810 Age-related osteoporosis without current pathological fracture: Secondary | ICD-10-CM

## 2021-12-30 LAB — CBC WITH DIFFERENTIAL/PLATELET
Basophils Absolute: 0.1 10*3/uL (ref 0.0–0.1)
Basophils Relative: 1.1 % (ref 0.0–3.0)
Eosinophils Absolute: 0.1 10*3/uL (ref 0.0–0.7)
Eosinophils Relative: 2.1 % (ref 0.0–5.0)
HCT: 37.6 % (ref 36.0–46.0)
Hemoglobin: 12.1 g/dL (ref 12.0–15.0)
Lymphocytes Relative: 29.7 % (ref 12.0–46.0)
Lymphs Abs: 1.9 10*3/uL (ref 0.7–4.0)
MCHC: 32.3 g/dL (ref 30.0–36.0)
MCV: 96.3 fl (ref 78.0–100.0)
Monocytes Absolute: 0.7 10*3/uL (ref 0.1–1.0)
Monocytes Relative: 11.3 % (ref 3.0–12.0)
Neutro Abs: 3.6 10*3/uL (ref 1.4–7.7)
Neutrophils Relative %: 55.8 % (ref 43.0–77.0)
Platelets: 307 10*3/uL (ref 150.0–400.0)
RBC: 3.9 Mil/uL (ref 3.87–5.11)
RDW: 13.9 % (ref 11.5–15.5)
WBC: 6.4 10*3/uL (ref 4.0–10.5)

## 2021-12-30 LAB — COMPREHENSIVE METABOLIC PANEL
ALT: 14 U/L (ref 0–35)
AST: 13 U/L (ref 0–37)
Albumin: 3.6 g/dL (ref 3.5–5.2)
Alkaline Phosphatase: 79 U/L (ref 39–117)
BUN: 15 mg/dL (ref 6–23)
CO2: 28 mEq/L (ref 19–32)
Calcium: 9 mg/dL (ref 8.4–10.5)
Chloride: 103 mEq/L (ref 96–112)
Creatinine, Ser: 0.59 mg/dL (ref 0.40–1.20)
GFR: 87.16 mL/min (ref >60.00)
Glucose, Bld: 109 mg/dL — ABNORMAL HIGH (ref 70–99)
Potassium: 4.1 mEq/L (ref 3.5–5.1)
Sodium: 138 mEq/L (ref 135–145)
Total Bilirubin: 0.5 mg/dL (ref 0.2–1.2)
Total Protein: 6.8 g/dL (ref 6.0–8.3)

## 2021-12-30 LAB — TSH: TSH: 0.04 u[IU]/mL — ABNORMAL LOW (ref 0.35–5.50)

## 2021-12-30 MED ORDER — CYANOCOBALAMIN 1000 MCG/ML IJ SOLN
1000.0000 ug | Freq: Once | INTRAMUSCULAR | Status: AC
  Administered 2021-12-30: 1000 ug via INTRAMUSCULAR

## 2021-12-30 MED ORDER — AMLODIPINE BESYLATE 10 MG PO TABS: ORAL_TABLET | ORAL | 3 refills | Status: DC

## 2021-12-30 NOTE — Progress Notes (Signed)
Pt here for monthly B12 injection per Mickel Baas order B-12 early   B12 1063mcg given IM, and pt tolerated injection well.  Pt will call back to schedule next B12
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
## 2022-01-03 ENCOUNTER — Other Ambulatory Visit: Payer: Self-pay | Admitting: Family

## 2022-01-08 ENCOUNTER — Ambulatory Visit: Payer: Medicare Other

## 2022-01-15 ENCOUNTER — Other Ambulatory Visit: Payer: Self-pay | Admitting: Family

## 2022-01-15 DIAGNOSIS — E039 Hypothyroidism, unspecified: Secondary | ICD-10-CM

## 2022-01-29 ENCOUNTER — Ambulatory Visit (INDEPENDENT_AMBULATORY_CARE_PROVIDER_SITE_OTHER): Payer: Medicare Other

## 2022-01-29 DIAGNOSIS — E538 Deficiency of other specified B group vitamins: Secondary | ICD-10-CM | POA: Diagnosis not present

## 2022-01-29 MED ORDER — CYANOCOBALAMIN 1000 MCG/ML IJ SOLN
1000.0000 ug | Freq: Once | INTRAMUSCULAR | Status: AC
  Administered 2022-01-29: 1000 ug via INTRAMUSCULAR

## 2022-01-29 NOTE — Progress Notes (Signed)
Dorothy Ritter is a 76 y.o. female presents to the office today for a B12 injection, per physician's orders. Original order: per Olive Bass, FNP  cyanocobalamin, 1000 mg/ml IM was administered in right deltoid today.   Patient tolerated injection well.

## 2022-02-05 ENCOUNTER — Telehealth: Payer: Self-pay | Admitting: Family

## 2022-02-05 NOTE — Telephone Encounter (Signed)
Left message for patient to call back and schedule Medicare Annual Wellness Visit (AWV).   Please offer to do virtually or by telephone.  Left office number and my jabber [phone].  Last AWV:08/24/2018  Please schedule at anytime with Nurse Health Advisor.

## 2022-02-12 ENCOUNTER — Other Ambulatory Visit: Payer: Self-pay | Admitting: Family

## 2022-02-12 NOTE — Telephone Encounter (Signed)
Requesting: tramadol 50mg   Contract: None UDS: None Last Visit: 12/30/21 Next Visit: None Last Refill: 12/30/21 #30 and 0RF  Please Advise

## 2022-02-26 ENCOUNTER — Ambulatory Visit (INDEPENDENT_AMBULATORY_CARE_PROVIDER_SITE_OTHER): Payer: Medicare Other

## 2022-02-26 DIAGNOSIS — E538 Deficiency of other specified B group vitamins: Secondary | ICD-10-CM | POA: Diagnosis not present

## 2022-02-26 MED ORDER — CYANOCOBALAMIN 1000 MCG/ML IJ SOLN
1000.0000 ug | Freq: Once | INTRAMUSCULAR | Status: AC
  Administered 2022-02-26: 1000 ug via INTRAMUSCULAR

## 2022-02-26 NOTE — Progress Notes (Signed)
Pt here for monthly B12 injection per Vernona Rieger.   B12 given R deltoid IM, and pt tolerated injection well.  Next B12 injection scheduled for 1 month.

## 2022-03-05 ENCOUNTER — Ambulatory Visit (INDEPENDENT_AMBULATORY_CARE_PROVIDER_SITE_OTHER): Payer: Medicare Other | Admitting: Family

## 2022-03-05 ENCOUNTER — Encounter: Payer: Self-pay | Admitting: Family

## 2022-03-05 VITALS — BP 110/60 | HR 74 | Temp 98.0°F | Ht 65.0 in | Wt 227.4 lb

## 2022-03-05 DIAGNOSIS — I10 Essential (primary) hypertension: Secondary | ICD-10-CM | POA: Diagnosis not present

## 2022-03-05 DIAGNOSIS — E039 Hypothyroidism, unspecified: Secondary | ICD-10-CM | POA: Diagnosis not present

## 2022-03-05 DIAGNOSIS — R600 Localized edema: Secondary | ICD-10-CM | POA: Diagnosis not present

## 2022-03-05 LAB — COMPREHENSIVE METABOLIC PANEL
ALT: 10 U/L (ref 0–35)
AST: 12 U/L (ref 0–37)
Albumin: 3.6 g/dL (ref 3.5–5.2)
Alkaline Phosphatase: 79 U/L (ref 39–117)
BUN: 14 mg/dL (ref 6–23)
CO2: 29 mEq/L (ref 19–32)
Calcium: 8.6 mg/dL (ref 8.4–10.5)
Chloride: 105 mEq/L (ref 96–112)
Creatinine, Ser: 0.66 mg/dL (ref 0.40–1.20)
GFR: 84.73 mL/min (ref >60.00)
Glucose, Bld: 85 mg/dL (ref 70–99)
Potassium: 3.8 mEq/L (ref 3.5–5.1)
Sodium: 140 mEq/L (ref 135–145)
Total Bilirubin: 0.3 mg/dL (ref 0.2–1.2)
Total Protein: 6.8 g/dL (ref 6.0–8.3)

## 2022-03-05 LAB — TSH: TSH: 0.58 u[IU]/mL (ref 0.35–5.50)

## 2022-03-05 LAB — BRAIN NATRIURETIC PEPTIDE: Pro B Natriuretic peptide (BNP): 27 pg/mL (ref 0.0–100.0)

## 2022-03-05 MED ORDER — VALSARTAN-HYDROCHLOROTHIAZIDE 160-12.5 MG PO TABS: 1.0000 | ORAL_TABLET | Freq: Every day | ORAL | 0 refills | Status: DC
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
## 2022-03-06 ENCOUNTER — Ambulatory Visit (HOSPITAL_COMMUNITY)
Admission: RE | Admit: 2022-03-06 | Discharge: 2022-03-06 | Disposition: A | Discharge status: Home / Self Care | Payer: Medicare Other | Source: Ambulatory Visit | Attending: Cardiology | Admitting: Cardiology

## 2022-03-06 DIAGNOSIS — R600 Localized edema: Secondary | ICD-10-CM

## 2022-03-06 LAB — D-DIMER, QUANTITATIVE: D-Dimer, Quant: 0.72 mcg/mL FEU — ABNORMAL HIGH (ref <0.50)

## 2022-04-01 ENCOUNTER — Ambulatory Visit (INDEPENDENT_AMBULATORY_CARE_PROVIDER_SITE_OTHER): Payer: Medicare Other

## 2022-04-01 DIAGNOSIS — Z23 Encounter for immunization: Secondary | ICD-10-CM | POA: Diagnosis not present

## 2022-04-01 DIAGNOSIS — E538 Deficiency of other specified B group vitamins: Secondary | ICD-10-CM

## 2022-04-01 MED ORDER — CYANOCOBALAMIN 1000 MCG/ML IJ SOLN: 1000.0000 ug | Freq: Once | INTRAMUSCULAR | Status: DC

## 2022-04-01 NOTE — Progress Notes (Signed)
Pt here for monthly B12 injection per Marrian Salvage FNP  B12 1061mcg given IM left deltoid, and pt tolerated injection well.  Next B12 injection scheduled for next month

## 2022-04-29 ENCOUNTER — Ambulatory Visit (INDEPENDENT_AMBULATORY_CARE_PROVIDER_SITE_OTHER): Payer: Medicare Other

## 2022-04-29 DIAGNOSIS — E538 Deficiency of other specified B group vitamins: Secondary | ICD-10-CM

## 2022-04-29 MED ORDER — CYANOCOBALAMIN 1000 MCG/ML IJ SOLN
1000.0000 ug | Freq: Once | INTRAMUSCULAR | Status: AC
  Administered 2022-04-29: 1000 ug via INTRAMUSCULAR

## 2022-04-29 NOTE — Progress Notes (Signed)
Pt here for monthly B12 injection per Mickel Baas order B-12 early   B12 1063mcg given IM, and pt tolerated injection well.  Pt will call back to schedule next B12

## 2022-05-25 ENCOUNTER — Other Ambulatory Visit: Payer: Self-pay | Admitting: Family

## 2022-05-25 DIAGNOSIS — E785 Hyperlipidemia, unspecified: Secondary | ICD-10-CM

## 2022-05-27 ENCOUNTER — Ambulatory Visit (INDEPENDENT_AMBULATORY_CARE_PROVIDER_SITE_OTHER): Payer: Medicare Other

## 2022-05-27 DIAGNOSIS — E538 Deficiency of other specified B group vitamins: Secondary | ICD-10-CM

## 2022-05-27 MED ORDER — CYANOCOBALAMIN 1000 MCG/ML IJ SOLN
1000.0000 ug | Freq: Once | INTRAMUSCULAR | Status: AC
  Administered 2022-05-27: 1000 ug via INTRAMUSCULAR

## 2022-05-27 NOTE — Progress Notes (Signed)
Pt here for monthly B12 injection per Vernona Rieger order B-12 early  B12 given IM, and pt tolerated injection well.  Pt will call back to schedule next B12

## 2022-06-15 ENCOUNTER — Telehealth: Payer: Self-pay | Admitting: Family

## 2022-06-15 NOTE — Telephone Encounter (Signed)
Copied from CRM [id number]. Topic: Medicare AWV >> Jun 15, 2022 10:12 AM Gwenith Spitz wrote: Reason for CRM: LVM PATIENT TO CALL [phone] KAREN TO SCHEDULE AWVS

## 2022-06-26 ENCOUNTER — Ambulatory Visit: Payer: Medicare Other

## 2022-06-26 ENCOUNTER — Ambulatory Visit (INDEPENDENT_AMBULATORY_CARE_PROVIDER_SITE_OTHER): Payer: Medicare Other | Admitting: *Deleted

## 2022-06-26 VITALS — BP 131/80 | HR 79 | Ht 65.0 in | Wt 229.4 lb

## 2022-06-26 DIAGNOSIS — Z0000 Encounter for general adult medical examination without abnormal findings: Secondary | ICD-10-CM | POA: Diagnosis not present

## 2022-06-26 DIAGNOSIS — E538 Deficiency of other specified B group vitamins: Secondary | ICD-10-CM

## 2022-06-26 MED ORDER — CYANOCOBALAMIN 1000 MCG/ML IJ SOLN
1000.0000 ug | Freq: Once | INTRAMUSCULAR | Status: AC
  Administered 2022-06-26: 1000 ug via INTRAMUSCULAR

## 2022-06-26 NOTE — Patient Instructions (Signed)
Dorothy Ritter , Thank you for taking time to come for your Medicare Wellness Visit. I appreciate your ongoing commitment to your health goals. Please review the following plan we discussed and let me know if I can assist you in the future.   These are the goals we discussed:  Goals      Patient Stated     Stay as healthy and as independent as possible.         This is a list of the screening recommended for you and due dates:  Health Maintenance  Topic Date Due   Zoster (Shingles) Vaccine (1 of 2) Never done   COVID-19 Vaccine (3 - 2023-24 season) 03/06/2022   Medicare Annual Wellness Visit  06/27/2023   DTaP/Tdap/Td vaccine (2 - Td or Tdap) 12/22/2025   Pneumonia Vaccine  Completed   Flu Shot  Completed   DEXA scan (bone density measurement)  Completed   Hepatitis C Screening: USPSTF Recommendation to screen - Ages 90-79 yo.  Completed   HPV Vaccine  Aged Out   Cologuard (Stool DNA test)  Discontinued     Next appointment: Follow up in one year for your annual wellness visit    Preventive Care 65 Years and Older, Female Preventive care refers to lifestyle choices and visits with your health care provider that can promote health and wellness. What does preventive care include? A yearly physical exam. This is also called an annual well check. Dental exams once or twice a year. Routine eye exams. Ask your health care provider how often you should have your eyes checked. Personal lifestyle choices, including: Daily care of your teeth and gums. Regular physical activity. Eating a healthy diet. Avoiding tobacco and drug use. Limiting alcohol use. Practicing safe sex. Taking low-dose aspirin every day. Taking vitamin and mineral supplements as recommended by your health care provider. What happens during an annual well check? The services and screenings done by your health care provider during your annual well check will depend on your age, overall health, lifestyle risk  factors, and family history of disease. Counseling  Your health care provider may ask you questions about your: Alcohol use. Tobacco use. Drug use. Emotional well-being. Home and relationship well-being. Sexual activity. Eating habits. History of falls. Memory and ability to understand (cognition). Work and work Astronomer. Reproductive health. Screening  You may have the following tests or measurements: Height, weight, and BMI. Blood pressure. Lipid and cholesterol levels. These may be checked every 5 years, or more frequently if you are over 48 years old. Skin check. Lung cancer screening. You may have this screening every year starting at age 70 if you have a 30-pack-year history of smoking and currently smoke or have quit within the past 15 years. Fecal occult blood test (FOBT) of the stool. You may have this test every year starting at age 60. Flexible sigmoidoscopy or colonoscopy. You may have a sigmoidoscopy every 5 years or a colonoscopy every 10 years starting at age 24. Hepatitis C blood test. Hepatitis B blood test. Sexually transmitted disease (STD) testing. Diabetes screening. This is done by checking your blood sugar (glucose) after you have not eaten for a while (fasting). You may have this done every 1-3 years. Bone density scan. This is done to screen for osteoporosis. You may have this done starting at age 70. Mammogram. This may be done every 1-2 years. Talk to your health care provider about how often you should have regular mammograms. Talk with your health care  provider about your test results, treatment options, and if necessary, the need for more tests. Vaccines  Your health care provider may recommend certain vaccines, such as: Influenza vaccine. This is recommended every year. Tetanus, diphtheria, and acellular pertussis (Tdap, Td) vaccine. You may need a Td booster every 10 years. Zoster vaccine. You may need this after age 67. Pneumococcal 13-valent  conjugate (PCV13) vaccine. One dose is recommended after age 25. Pneumococcal polysaccharide (PPSV23) vaccine. One dose is recommended after age 84. Talk to your health care provider about which screenings and vaccines you need and how often you need them. This information is not intended to replace advice given to you by your health care provider. Make sure you discuss any questions you have with your health care provider. Document Released: 07/19/2015 Document Revised: 03/11/2016 Document Reviewed: 04/23/2015 Elsevier Interactive Patient Education  2017 [employer] Prevention in the Home Falls can cause injuries. They can happen to people of all ages. There are many things you can do to make your home safe and to help prevent falls. What can I do on the outside of my home? Regularly fix the edges of walkways and driveways and fix any cracks. Remove anything that might make you trip as you walk through a door, such as a raised step or threshold. Trim any bushes or trees on the path to your home. Use bright outdoor lighting. Clear any walking paths of anything that might make someone trip, such as rocks or tools. Regularly check to see if handrails are loose or broken. Make sure that both sides of any steps have handrails. Any raised decks and porches should have guardrails on the edges. Have any leaves, snow, or ice cleared regularly. Use sand or salt on walking paths during winter. Clean up any spills in your garage right away. This includes oil or grease spills. What can I do in the bathroom? Use night lights. Install grab bars by the toilet and in the tub and shower. Do not use towel bars as grab bars. Use non-skid mats or decals in the tub or shower. If you need to sit down in the shower, use a plastic, non-slip stool. Keep the floor dry. Clean up any water that spills on the floor as soon as it happens. Remove soap buildup in the tub or shower regularly. Attach bath mats  securely with double-sided non-slip rug tape. Do not have throw rugs and other things on the floor that can make you trip. What can I do in the bedroom? Use night lights. Make sure that you have a light by your bed that is easy to reach. Do not use any sheets or blankets that are too big for your bed. They should not hang down onto the floor. Have a firm chair that has side arms. You can use this for support while you get dressed. Do not have throw rugs and other things on the floor that can make you trip. What can I do in the kitchen? Clean up any spills right away. Avoid walking on wet floors. Keep items that you use a lot in easy-to-reach places. If you need to reach something above you, use a strong step stool that has a grab bar. Keep electrical cords out of the way. Do not use floor polish or wax that makes floors slippery. If you must use wax, use non-skid floor wax. Do not have throw rugs and other things on the floor that can make you trip. What can I  do with my stairs? Do not leave any items on the stairs. Make sure that there are handrails on both sides of the stairs and use them. Fix handrails that are broken or loose. Make sure that handrails are as long as the stairways. Check any carpeting to make sure that it is firmly attached to the stairs. Fix any carpet that is loose or worn. Avoid having throw rugs at the top or bottom of the stairs. If you do have throw rugs, attach them to the floor with carpet tape. Make sure that you have a light switch at the top of the stairs and the bottom of the stairs. If you do not have them, ask someone to add them for you. What else can I do to help prevent falls? Wear shoes that: Do not have high heels. Have rubber bottoms. Are comfortable and fit you well. Are closed at the toe. Do not wear sandals. If you use a stepladder: Make sure that it is fully opened. Do not climb a closed stepladder. Make sure that both sides of the stepladder  are locked into place. Ask someone to hold it for you, if possible. Clearly mark and make sure that you can see: Any grab bars or handrails. First and last steps. Where the edge of each step is. Use tools that help you move around (mobility aids) if they are needed. These include: Canes. Walkers. Scooters. Crutches. Turn on the lights when you go into a dark area. Replace any light bulbs as soon as they burn out. Set up your furniture so you have a clear path. Avoid moving your furniture around. If any of your floors are uneven, fix them. If there are any pets around you, be aware of where they are. Review your medicines with your doctor. Some medicines can make you feel dizzy. This can increase your chance of falling. Ask your doctor what other things that you can do to help prevent falls. This information is not intended to replace advice given to you by your health care provider. Make sure you discuss any questions you have with your health care provider. Document Released: 04/18/2009 Document Revised: 11/28/2015 Document Reviewed: 07/27/2014 Elsevier Interactive Patient Education  2017 [employer].
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
## 2022-07-29 ENCOUNTER — Ambulatory Visit: Payer: Medicare Other

## 2022-07-29 ENCOUNTER — Telehealth: Payer: Self-pay

## 2022-07-29 NOTE — Telephone Encounter (Signed)
Appt changed

## 2022-07-29 NOTE — Telephone Encounter (Signed)
Caller Name [place] Phone Number [phone] Patient Name Dorothy Ritter Patient DOB 04/18/1946 Call Type Message Only Information Provided Reason for Call Request for General Office Information Initial Comment Caller states she has an appt and she would like to change her appt time. Additional Comment Office hours provided. Please call pt back. Disp. Time Disposition Final User 07/28/2022 9:12:47 PM General Information Provided Yes [name] Call Closed By: [name] Transaction Date/Time: 07/28/2022 9:09:34 PM (ET)

## 2022-07-31 ENCOUNTER — Ambulatory Visit (INDEPENDENT_AMBULATORY_CARE_PROVIDER_SITE_OTHER): Payer: Medicare Other

## 2022-07-31 DIAGNOSIS — E538 Deficiency of other specified B group vitamins: Secondary | ICD-10-CM

## 2022-07-31 MED ORDER — CYANOCOBALAMIN 1000 MCG/ML IJ SOLN
1000.0000 ug | Freq: Once | INTRAMUSCULAR | Status: AC
  Administered 2022-07-31: 1000 ug via INTRAMUSCULAR

## 2022-07-31 NOTE — Progress Notes (Signed)
Dorothy Ritter is a 77 y.o. female presents to the office today for B12 injections, per physician's orders. Original order: per last ov note from 12-05-21 "B12 shot updated; follow up in 1 month" cyanocobalamin (med), 1000 mg/ml (dose),  IM (route) was administered Left deltoid (location) today. Patient tolerated injection. Patient due for follow up labs/provider appt: Yes. Date due: in one month for 6 months follow up, appt made Yes for 09/01/22. She will have one month B12 shot at that time.    Jiles Prows

## 2022-08-17 ENCOUNTER — Telehealth: Payer: Self-pay

## 2022-08-17 NOTE — Telephone Encounter (Signed)
Requesting: Tramadol Contract: N/A UDS: N/A Last Visit: 03/05/2022 Next Visit: 09/01/2022 Last Refill: 02/16/2022  Please Advise

## 2022-08-17 NOTE — Telephone Encounter (Signed)
Spoke with pt, Pt is requesting a refill on tramadol.

## 2022-08-17 NOTE — Telephone Encounter (Signed)
Initial Comment Caller states that she needs a refill on her Translation No Disp. Time Eilene Ghazi Time) Disposition Final User 08/16/2022 3:29:52 PM Attempt made - no message left Laqueta Due, RN, Amber 08/16/2022 3:31:31 PM Send To RN Personal Laqueta Due, RN, Amber 08/16/2022 3:41:48 PM FINAL ATTEMPT MADE - message left Yes Radford Pax, RN, Eugene Garnet Final Disposition 08/16/2022 3:41:48 PM FINAL ATTEMPT MADE - message left

## 2022-08-18 ENCOUNTER — Other Ambulatory Visit: Payer: Self-pay | Admitting: Family

## 2022-08-18 DIAGNOSIS — I10 Essential (primary) hypertension: Secondary | ICD-10-CM

## 2022-08-18 DIAGNOSIS — R609 Edema, unspecified: Secondary | ICD-10-CM

## 2022-08-18 MED ORDER — TRAMADOL HCL 50 MG PO TABS: ORAL_TABLET | ORAL | 0 refills | Status: DC

## 2022-08-18 NOTE — Telephone Encounter (Signed)
Rx sent by PCP.  

## 2022-09-01 ENCOUNTER — Ambulatory Visit (INDEPENDENT_AMBULATORY_CARE_PROVIDER_SITE_OTHER): Payer: Medicare Other | Admitting: Family

## 2022-09-01 VITALS — BP 116/72 | HR 76 | Resp 18 | Ht 65.0 in | Wt 233.2 lb

## 2022-09-01 DIAGNOSIS — E538 Deficiency of other specified B group vitamins: Secondary | ICD-10-CM | POA: Diagnosis not present

## 2022-09-01 DIAGNOSIS — M5416 Radiculopathy, lumbar region: Secondary | ICD-10-CM

## 2022-09-01 DIAGNOSIS — E039 Hypothyroidism, unspecified: Secondary | ICD-10-CM

## 2022-09-01 DIAGNOSIS — E785 Hyperlipidemia, unspecified: Secondary | ICD-10-CM | POA: Diagnosis not present

## 2022-09-01 DIAGNOSIS — I10 Essential (primary) hypertension: Secondary | ICD-10-CM | POA: Diagnosis not present

## 2022-09-01 DIAGNOSIS — D61818 Other pancytopenia: Secondary | ICD-10-CM | POA: Diagnosis not present

## 2022-09-01 LAB — CBC WITH DIFFERENTIAL/PLATELET
Basophils Absolute: 0.1 10*3/uL (ref 0.0–0.1)
Basophils Relative: 1.3 % (ref 0.0–3.0)
Eosinophils Absolute: 0.2 10*3/uL (ref 0.0–0.7)
Eosinophils Relative: 2.5 % (ref 0.0–5.0)
HCT: 39.6 % (ref 36.0–46.0)
Hemoglobin: 13 g/dL (ref 12.0–15.0)
Lymphocytes Relative: 35.1 % (ref 12.0–46.0)
Lymphs Abs: 2.4 10*3/uL (ref 0.7–4.0)
MCHC: 32.8 g/dL (ref 30.0–36.0)
MCV: 98.5 fl (ref 78.0–100.0)
Monocytes Absolute: 0.6 10*3/uL (ref 0.1–1.0)
Monocytes Relative: 8.3 % (ref 3.0–12.0)
Neutro Abs: 3.6 10*3/uL (ref 1.4–7.7)
Neutrophils Relative %: 52.8 % (ref 43.0–77.0)
Platelets: 298 10*3/uL (ref 150.0–400.0)
RBC: 4.02 Mil/uL (ref 3.87–5.11)
RDW: 14.6 % (ref 11.5–15.5)
WBC: 6.8 10*3/uL (ref 4.0–10.5)

## 2022-09-01 LAB — TSH: TSH: 12.07 u[IU]/mL — ABNORMAL HIGH (ref 0.35–5.50)

## 2022-09-01 LAB — COMPREHENSIVE METABOLIC PANEL
ALT: 13 U/L (ref 0–35)
AST: 18 U/L (ref 0–37)
Albumin: 3.9 g/dL (ref 3.5–5.2)
Alkaline Phosphatase: 73 U/L (ref 39–117)
BUN: 17 mg/dL (ref 6–23)
CO2: 31 mEq/L (ref 19–32)
Calcium: 9.1 mg/dL (ref 8.4–10.5)
Chloride: 101 mEq/L (ref 96–112)
Creatinine, Ser: 0.91 mg/dL (ref 0.40–1.20)
GFR: 60.77 mL/min (ref >60.00)
Glucose, Bld: 94 mg/dL (ref 70–99)
Potassium: 3.7 mEq/L (ref 3.5–5.1)
Sodium: 140 mEq/L (ref 135–145)
Total Bilirubin: 0.5 mg/dL (ref 0.2–1.2)
Total Protein: 7.4 g/dL (ref 6.0–8.3)

## 2022-09-01 LAB — LIPID PANEL
Cholesterol: 210 mg/dL — ABNORMAL HIGH (ref 0–200)
HDL: 84.8 mg/dL (ref >39.00)
LDL Cholesterol: 107 mg/dL — ABNORMAL HIGH (ref 0–99)
NonHDL: 125.4
Total CHOL/HDL Ratio: 2
Triglycerides: 91 mg/dL (ref 0.0–149.0)
VLDL: 18.2 mg/dL (ref 0.0–40.0)

## 2022-09-01 MED ORDER — TRAMADOL HCL 50 MG PO TABS: ORAL_TABLET | ORAL | 0 refills | Status: DC

## 2022-09-01 MED ORDER — VALSARTAN-HYDROCHLOROTHIAZIDE 160-12.5 MG PO TABS: 1.0000 | ORAL_TABLET | Freq: Every day | ORAL | 3 refills | Status: DC

## 2022-09-01 MED ORDER — CYANOCOBALAMIN 1000 MCG/ML IJ SOLN
1000.0000 ug | Freq: Once | INTRAMUSCULAR | Status: AC
  Administered 2022-09-01: 1000 ug via INTRAMUSCULAR
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
## 2022-09-02 ENCOUNTER — Other Ambulatory Visit: Payer: Self-pay | Admitting: Family

## 2022-09-02 DIAGNOSIS — E039 Hypothyroidism, unspecified: Secondary | ICD-10-CM

## 2022-09-02 MED ORDER — LEVOTHYROXINE SODIUM 125 MCG PO TABS: 125.0000 ug | ORAL_TABLET | Freq: Every day | ORAL | 0 refills | Status: DC

## 2022-09-11 DIAGNOSIS — H2513 Age-related nuclear cataract, bilateral: Secondary | ICD-10-CM | POA: Diagnosis not present

## 2022-10-01 ENCOUNTER — Ambulatory Visit (INDEPENDENT_AMBULATORY_CARE_PROVIDER_SITE_OTHER): Payer: Medicare Other

## 2022-10-01 DIAGNOSIS — E538 Deficiency of other specified B group vitamins: Secondary | ICD-10-CM | POA: Diagnosis not present

## 2022-10-01 MED ORDER — CYANOCOBALAMIN 1000 MCG/ML IJ SOLN
1000.0000 ug | Freq: Once | INTRAMUSCULAR | Status: AC
  Administered 2022-10-01: 1000 ug via INTRAMUSCULAR

## 2022-10-01 NOTE — Progress Notes (Signed)
Pt here for monthly B12 injection per Jodi Mourning, FNP  B12 1060mcg given IM left deltoid, and pt tolerated injection well.  Next B12 injection scheduled for 10/30/2022

## 2022-10-30 ENCOUNTER — Ambulatory Visit (INDEPENDENT_AMBULATORY_CARE_PROVIDER_SITE_OTHER): Payer: Medicare Other

## 2022-10-30 DIAGNOSIS — E538 Deficiency of other specified B group vitamins: Secondary | ICD-10-CM | POA: Diagnosis not present

## 2022-10-30 MED ORDER — CYANOCOBALAMIN 1000 MCG/ML IJ SOLN
1000.0000 ug | Freq: Once | INTRAMUSCULAR | Status: AC
Start: 2022-10-30 — End: 2022-10-30
  Administered 2022-10-30: 1000 ug via INTRAMUSCULAR

## 2022-10-30 NOTE — Progress Notes (Signed)
Pt here for monthly B12 injection per [name] Murray, FNP  B12 1000mcg given IM left deltoid, and pt tolerated injection well.  Next B12 injection scheduled for 10/30/2022 

## 2022-11-11 ENCOUNTER — Telehealth: Payer: Self-pay | Admitting: Family

## 2022-11-11 NOTE — Telephone Encounter (Signed)
Pt said she has scratchy throat, head congestion, and is coughing. She would like to see if something can be called in before it get worse. Pt was advised she will more than likely need to be seen before a prescription would be sent in.

## 2022-11-12 ENCOUNTER — Ambulatory Visit (INDEPENDENT_AMBULATORY_CARE_PROVIDER_SITE_OTHER): Payer: Medicare Other | Admitting: Family

## 2022-11-12 ENCOUNTER — Encounter: Payer: Self-pay | Admitting: Family

## 2022-11-12 VITALS — BP 132/84 | HR 90 | Ht 65.0 in | Wt 229.6 lb

## 2022-11-12 DIAGNOSIS — G8929 Other chronic pain: Secondary | ICD-10-CM

## 2022-11-12 DIAGNOSIS — M549 Dorsalgia, unspecified: Secondary | ICD-10-CM

## 2022-11-12 DIAGNOSIS — E039 Hypothyroidism, unspecified: Secondary | ICD-10-CM

## 2022-11-12 DIAGNOSIS — J209 Acute bronchitis, unspecified: Secondary | ICD-10-CM

## 2022-11-12 MED ORDER — AZITHROMYCIN 250 MG PO TABS: ORAL_TABLET | ORAL | 0 refills | Status: DC

## 2022-11-12 NOTE — Telephone Encounter (Signed)
Spoke with pt and scheduled pt an appt 11/12/2022
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
## 2022-11-13 LAB — TSH: TSH: 16.39 u[IU]/mL — ABNORMAL HIGH (ref 0.35–5.50)

## 2022-11-19 ENCOUNTER — Other Ambulatory Visit: Payer: Self-pay | Admitting: Family

## 2022-11-19 ENCOUNTER — Encounter: Payer: Self-pay | Admitting: *Deleted

## 2022-11-19 ENCOUNTER — Telehealth: Payer: Self-pay

## 2022-11-19 DIAGNOSIS — R609 Edema, unspecified: Secondary | ICD-10-CM

## 2022-11-19 DIAGNOSIS — I10 Essential (primary) hypertension: Secondary | ICD-10-CM

## 2022-11-19 NOTE — Telephone Encounter (Signed)
Pt called about VM @ her medication .what dosage of Synthroid she was taking and she stated not home would call back not sure.

## 2022-11-19 NOTE — Telephone Encounter (Signed)
Pt called back stating that she is taking the 125 MCG and the pharmacy only gave her the first half of the script and they are currently preparing the other half.

## 2022-11-20 NOTE — Telephone Encounter (Signed)
Called pt and left a VM asking pt to call the office back to confirm what dosage of her thyroid medication she has been taking.

## 2022-11-25 NOTE — Telephone Encounter (Signed)
Called pt and left a VM asking pt to call the office back to confirm if she has been taking her thyroid medication daily.

## 2022-11-27 ENCOUNTER — Ambulatory Visit: Payer: Medicare Other

## 2022-11-27 NOTE — Telephone Encounter (Signed)
Called pt again and left a VM asking pt to call the office back to confirm she has been taking tyroid medication.

## 2022-12-01 ENCOUNTER — Ambulatory Visit (INDEPENDENT_AMBULATORY_CARE_PROVIDER_SITE_OTHER): Payer: Medicare Other | Admitting: Physical Medicine and Rehabilitation

## 2022-12-01 ENCOUNTER — Encounter: Payer: Self-pay | Admitting: Physical Medicine and Rehabilitation

## 2022-12-01 ENCOUNTER — Other Ambulatory Visit (INDEPENDENT_AMBULATORY_CARE_PROVIDER_SITE_OTHER): Payer: Medicare Other

## 2022-12-01 DIAGNOSIS — M4316 Spondylolisthesis, lumbar region: Secondary | ICD-10-CM

## 2022-12-01 DIAGNOSIS — R269 Unspecified abnormalities of gait and mobility: Secondary | ICD-10-CM

## 2022-12-01 DIAGNOSIS — M5416 Radiculopathy, lumbar region: Secondary | ICD-10-CM

## 2022-12-01 DIAGNOSIS — M5441 Lumbago with sciatica, right side: Secondary | ICD-10-CM | POA: Diagnosis not present

## 2022-12-01 DIAGNOSIS — G8929 Other chronic pain: Secondary | ICD-10-CM

## 2022-12-01 NOTE — Progress Notes (Unsigned)
Functional Pain Scale - descriptive words and definitions  Distressing (6)    Pain is present/unable to complete most ADLs limited by pain/sleep is difficult and active distraction is only marginal. Moderate range order  Average Pain  varies  Lower back pain on both sides but worse on right side with radiation into the right leg

## 2022-12-01 NOTE — Progress Notes (Signed)
   12/01/22 1523  Fall Screening  Falls in the past year? 0  Number of falls in past year 0  Was there an injury with Fall? 0  Fall Risk Category Calculator 0  Fall Risk  Patient at Risk for Falls Due to Impaired balance/gait;Impaired mobility  Fall risk Follow up Falls evaluation completed;Falls prevention discussed
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
## 2022-12-02 NOTE — Telephone Encounter (Signed)
Spoke with pt, pt states she had previously ran out of her medication and wasn't able to take it a few days and pt believes that is why her TSH levels were elevated. Pt states she was able to get a full refill and is back on track and feels much better.

## 2022-12-03 ENCOUNTER — Ambulatory Visit (INDEPENDENT_AMBULATORY_CARE_PROVIDER_SITE_OTHER): Payer: Medicare Other

## 2022-12-03 DIAGNOSIS — E538 Deficiency of other specified B group vitamins: Secondary | ICD-10-CM | POA: Diagnosis not present

## 2022-12-03 MED ORDER — CYANOCOBALAMIN 1000 MCG/ML IJ SOLN
1000.0000 ug | Freq: Once | INTRAMUSCULAR | Status: AC
Start: 2022-12-03 — End: 2022-12-03
  Administered 2022-12-03: 1000 ug via INTRAMUSCULAR

## 2022-12-03 NOTE — Progress Notes (Signed)
Pt here for monthly B12 injection per PCP order   B12 given IM L deltoid, and pt tolerated injection well.  Next B12 injection scheduled for 12/31/2022.

## 2022-12-04 ENCOUNTER — Other Ambulatory Visit: Payer: Self-pay | Admitting: Family

## 2022-12-04 DIAGNOSIS — E039 Hypothyroidism, unspecified: Secondary | ICD-10-CM

## 2022-12-04 NOTE — Telephone Encounter (Signed)
Lab appt should be scheduled 12/31/2022.

## 2022-12-04 NOTE — Telephone Encounter (Signed)
Called pt and left a VM asking pt to call the office back to get lab appt scheduled for 12/30/2022.

## 2022-12-07 NOTE — Telephone Encounter (Signed)
Called pt and left a VM advising pt to call the office back to get a lab appt scheduled for 12/31/2022.

## 2022-12-11 NOTE — Telephone Encounter (Signed)
Pt has lab appt scheduled 01/01/2023.

## 2022-12-16 ENCOUNTER — Telehealth: Payer: Self-pay | Admitting: Physical Medicine and Rehabilitation

## 2022-12-16 NOTE — Telephone Encounter (Signed)
Received message from [facility], they have tried to call patient 3 times to schedule lumbar MRI imaging, unable to reach her.

## 2022-12-22 ENCOUNTER — Ambulatory Visit
Admission: RE | Admit: 2022-12-22 | Discharge: 2022-12-22 | Disposition: A | Discharge status: Home / Self Care | Payer: Medicare Other | Source: Ambulatory Visit | Attending: Physical Medicine and Rehabilitation | Admitting: Physical Medicine and Rehabilitation

## 2022-12-22 DIAGNOSIS — M48061 Spinal stenosis, lumbar region without neurogenic claudication: Secondary | ICD-10-CM | POA: Diagnosis not present

## 2022-12-22 DIAGNOSIS — M47816 Spondylosis without myelopathy or radiculopathy, lumbar region: Secondary | ICD-10-CM | POA: Diagnosis not present

## 2022-12-22 DIAGNOSIS — M5416 Radiculopathy, lumbar region: Secondary | ICD-10-CM

## 2022-12-22 DIAGNOSIS — M4316 Spondylolisthesis, lumbar region: Secondary | ICD-10-CM | POA: Diagnosis not present

## 2022-12-22 DIAGNOSIS — M5126 Other intervertebral disc displacement, lumbar region: Secondary | ICD-10-CM | POA: Diagnosis not present

## 2022-12-31 ENCOUNTER — Ambulatory Visit: Payer: Medicare Other

## 2022-12-31 ENCOUNTER — Other Ambulatory Visit: Payer: Medicare Other

## 2023-01-01 ENCOUNTER — Other Ambulatory Visit (INDEPENDENT_AMBULATORY_CARE_PROVIDER_SITE_OTHER): Payer: Medicare Other

## 2023-01-01 ENCOUNTER — Ambulatory Visit (INDEPENDENT_AMBULATORY_CARE_PROVIDER_SITE_OTHER): Payer: Medicare Other

## 2023-01-01 DIAGNOSIS — E538 Deficiency of other specified B group vitamins: Secondary | ICD-10-CM | POA: Diagnosis not present

## 2023-01-01 DIAGNOSIS — E039 Hypothyroidism, unspecified: Secondary | ICD-10-CM | POA: Diagnosis not present

## 2023-01-01 LAB — TSH: TSH: 0.15 u[IU]/mL — ABNORMAL LOW (ref 0.35–5.50)

## 2023-01-01 MED ORDER — CYANOCOBALAMIN 1000 MCG/ML IJ SOLN
1000.0000 ug | Freq: Once | INTRAMUSCULAR | Status: AC
Start: 2023-01-01 — End: 2023-01-01
  Administered 2023-01-01: 1000 ug via INTRAMUSCULAR

## 2023-01-01 NOTE — Progress Notes (Cosign Needed Addendum)
Patient here for monthly b12 injection per physicians order.  Injection given in right deltoid and patient tolerated well   Last b12 level was checked 05/27/2021

## 2023-01-05 ENCOUNTER — Other Ambulatory Visit: Payer: Self-pay

## 2023-01-05 DIAGNOSIS — E039 Hypothyroidism, unspecified: Secondary | ICD-10-CM

## 2023-01-06 ENCOUNTER — Telehealth: Payer: Self-pay

## 2023-01-06 NOTE — Telephone Encounter (Signed)
Spoke with patient and scheduled OV for 01/15/23.

## 2023-01-06 NOTE — Telephone Encounter (Signed)
-----   Message from Tyrell Antonio, MD sent at 12/30/2022 11:16 AM EDT ----- Please schedule OV for MRI review

## 2023-01-15 ENCOUNTER — Ambulatory Visit: Payer: Medicare Other | Admitting: Physical Medicine and Rehabilitation

## 2023-01-15 DIAGNOSIS — M5441 Lumbago with sciatica, right side: Secondary | ICD-10-CM

## 2023-01-15 DIAGNOSIS — M48062 Spinal stenosis, lumbar region with neurogenic claudication: Secondary | ICD-10-CM

## 2023-01-15 DIAGNOSIS — R269 Unspecified abnormalities of gait and mobility: Secondary | ICD-10-CM | POA: Diagnosis not present

## 2023-01-15 DIAGNOSIS — M5416 Radiculopathy, lumbar region: Secondary | ICD-10-CM

## 2023-01-15 DIAGNOSIS — G8929 Other chronic pain: Secondary | ICD-10-CM
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
## 2023-01-17 ENCOUNTER — Encounter: Payer: Self-pay | Admitting: Physical Medicine and Rehabilitation

## 2023-01-19 ENCOUNTER — Telehealth: Payer: Self-pay | Admitting: Family

## 2023-01-19 ENCOUNTER — Ambulatory Visit: Payer: Medicare Other | Admitting: Family

## 2023-01-19 NOTE — Telephone Encounter (Signed)
Spoke with pt, pt was advised we do have her paperwork filled out ready for pick up, pt requested paperwork be mailed to her. Documents have been placed up front to be mailed out to pt.

## 2023-01-19 NOTE — Telephone Encounter (Signed)
I am so sorry- her paperwork for [employer] got buried in the bottom of a folder on my desk and was misplaced. I just found it and completed for her. Does she want to pick up on 8/1 when she is here or come pick up?

## 2023-01-26 ENCOUNTER — Telehealth: Payer: Self-pay | Admitting: Family

## 2023-01-26 NOTE — Telephone Encounter (Signed)
Prescription Request  01/26/2023  Is this a "Controlled Substance" medicine? Yes  LOV: 11/12/2022  What is the name of the medication or equipment?   traMADol (ULTRAM) 50 MG tablet [[id number]]   Have you contacted your pharmacy to request a refill? No   Which pharmacy would you like this sent to?  [id number] - [place], [place] - [address] AT [address] [place] [address] Phone: [phone] Fax: [phone]    Patient notified that their request is being sent to the clinical staff for review and that they should receive a response within 2 business days.   Please advise at Mobile [phone] (mobile)

## 2023-01-27 ENCOUNTER — Telehealth: Payer: Self-pay

## 2023-01-27 NOTE — Telephone Encounter (Signed)
Request has been sent to provider.  

## 2023-01-27 NOTE — Telephone Encounter (Signed)
Requesting: Tramadol Contract: 09/10/2022 UDS: N/A Last Visit: 11/12/2022 Next Visit: N/A Last Refill: 09/01/2022  Please Advise

## 2023-02-01 NOTE — Telephone Encounter (Signed)
Called pt, was not able to leave a VM due to VM not being set up.

## 2023-02-01 NOTE — Telephone Encounter (Signed)
Spoke with pt, pt is aware and expressed understanding. Pt stated she will "give them a call".

## 2023-02-04 ENCOUNTER — Ambulatory Visit: Payer: Medicare Other

## 2023-02-04 ENCOUNTER — Other Ambulatory Visit: Payer: Self-pay | Admitting: Family

## 2023-02-04 ENCOUNTER — Other Ambulatory Visit: Payer: Self-pay

## 2023-02-04 ENCOUNTER — Other Ambulatory Visit (INDEPENDENT_AMBULATORY_CARE_PROVIDER_SITE_OTHER): Payer: Medicare Other

## 2023-02-04 DIAGNOSIS — E039 Hypothyroidism, unspecified: Secondary | ICD-10-CM

## 2023-02-04 DIAGNOSIS — E538 Deficiency of other specified B group vitamins: Secondary | ICD-10-CM

## 2023-02-04 LAB — VITAMIN B12: Vitamin B-12: 420 pg/mL (ref 211–911)

## 2023-02-04 LAB — TSH: TSH: 0.1 u[IU]/mL — ABNORMAL LOW (ref 0.35–5.50)

## 2023-02-04 MED ORDER — TRAMADOL HCL 50 MG PO TABS: ORAL_TABLET | ORAL | 0 refills | Status: DC

## 2023-02-04 MED ORDER — CYANOCOBALAMIN 1000 MCG/ML IJ SOLN
1000.0000 ug | Freq: Once | INTRAMUSCULAR | Status: AC
Start: 2023-02-04 — End: 2023-02-04
  Administered 2023-02-04: 1000 ug via INTRAMUSCULAR

## 2023-02-04 MED ORDER — LEVOTHYROXINE SODIUM 112 MCG PO TABS
112.0000 ug | ORAL_TABLET | Freq: Every day | ORAL | 0 refills | Status: DC
Start: 2023-02-04 — End: 2023-03-26

## 2023-02-04 NOTE — Telephone Encounter (Signed)
Per patient she is seeing ortho but not being treated yet. She has not make a decision as to which route to take. She is requesting tramadol rx to treat pain in the mean time.

## 2023-02-04 NOTE — Progress Notes (Signed)
Dorothy Ritter is a 77 y.o. female presents to the office today for B12 injections, per physician's orders. Original order: today per Ria Clock NP to do B12 shot and get B12 labs today.  cyanocobalamin (med), 1000mg /ml (dose),  im (route) was administered left deltoid (location) today. Patient tolerated injection.  Patient next injection due: in one month if indicated, appt made Yes 03/10/2023.  [place]

## 2023-02-04 NOTE — Addendum Note (Signed)
Addended by: Maximino Sarin on: 02/04/2023 11:39 AM   Modules accepted: Orders

## 2023-02-05 ENCOUNTER — Encounter: Payer: Self-pay | Admitting: Physician Assistant

## 2023-02-05 ENCOUNTER — Ambulatory Visit: Payer: Medicare Other | Admitting: Physician Assistant

## 2023-02-05 VITALS — BP 128/74 | HR 76 | Temp 98.6°F | Resp 20 | Wt 210.0 lb

## 2023-02-05 DIAGNOSIS — R051 Acute cough: Secondary | ICD-10-CM | POA: Diagnosis not present

## 2023-02-05 LAB — POC COVID19 BINAXNOW: SARS Coronavirus 2 Ag: NEGATIVE

## 2023-02-05 MED ORDER — BENZONATATE 100 MG PO CAPS: 100.0000 mg | ORAL_CAPSULE | Freq: Two times a day (BID) | ORAL | 0 refills | Status: DC | PRN

## 2023-02-05 NOTE — Progress Notes (Signed)
Established patient visit   Patient: Dorothy Ritter   DOB: [birth year]   77 y.o. Female  MRN: [id number] Visit Date: 02/05/2023  Today's healthcare provider: Alfredia Ferguson, PA-C   Chief Complaint  Patient presents with   Cough    Productive with a little yellowish phlegm    Sore Throat   Subjective    HPI Pt reports cough productive with yellow phlegm, sore throat, x 1 day. Denies wheezing, change in SOB. Uses albuterol prn but not more recently.  Denies fever.  Medications: Outpatient Medications Prior to Visit  Medication Sig   albuterol (VENTOLIN HFA) 108 (90 Base) MCG/ACT inhaler INHALE 2 PUFFS INTO THE LUNGS EVERY 6 HOURS AS NEEDED FOR WHEEZING OR SHORTNESS OF BREATH   aspirin 81 MG tablet Take 1 tablet (81 mg total) by mouth daily.   Cholecalciferol (VITAMIN D) 50 MCG (2000 UT) tablet Take 2,000 Units by mouth daily.   fluticasone (FLONASE) 50 MCG/ACT nasal spray Place 2 sprays into both nostrils daily.   levothyroxine (SYNTHROID) 112 MCG tablet Take 1 tablet (112 mcg total) by mouth daily before breakfast.   potassium chloride (KLOR-CON) 8 MEQ tablet TAKE 1 TABLET(8 MEQ) BY MOUTH DAILY   pravastatin (PRAVACHOL) 40 MG tablet TAKE 1 TABLET(40 MG) BY MOUTH DAILY   traMADol (ULTRAM) 50 MG tablet TAKE 1 TABLET(50 MG) BY MOUTH DAILY AS NEEDED FOR MODERATE PAIN   valsartan-hydrochlorothiazide (DIOVAN-HCT) 160-12.5 MG tablet Take 1 tablet by mouth daily.   No facility-administered medications prior to visit.    Review of Systems  Constitutional:  Negative for fatigue and fever.  HENT:  Positive for congestion and sore throat.   Respiratory:  Positive for cough. Negative for shortness of breath.   Cardiovascular:  Negative for chest pain and leg swelling.  Gastrointestinal:  Negative for abdominal pain.  Neurological:  Negative for dizziness and headaches.       Objective    BP 128/74 (BP Location: Left Arm, Patient Position: Sitting, Cuff Size: Normal)    Pulse 76   Temp 98.6 F (37 C)   Resp 20   Wt 210 lb (95.3 kg)   SpO2 96%   BMI 34.95 kg/m    Physical Exam Constitutional:      General: She is awake.     Appearance: She is well-developed.  HENT:     Head: Normocephalic.  Eyes:     Conjunctiva/sclera: Conjunctivae normal.  Cardiovascular:     Rate and Rhythm: Normal rate and regular rhythm.     Heart sounds: Normal heart sounds.  Pulmonary:     Effort: Pulmonary effort is normal.     Breath sounds: Normal breath sounds.  Skin:    General: Skin is warm.  Neurological:     Mental Status: She is alert and oriented to person, place, and time.  Psychiatric:        Attention and Perception: Attention normal.        Mood and Affect: Mood normal.        Speech: Speech normal.        Behavior: Behavior is cooperative.      Results for orders placed or performed in visit on 02/05/23  POC COVID-19  Result Value Ref Range   SARS Coronavirus 2 Ag Negative Negative    Assessment & Plan     1. Acute cough Covid negative. Lungs clear Recommending fluids, rest, tylenol, mucinex. Continue albuterol prn. Rx tessalon for cough. If any sob, persistent fevers,  call office.  - POC COVID-19  Return if symptoms worsen or fail to improve.      I, Alfredia Ferguson, PA-C have reviewed all documentation for this visit. The documentation on  02/05/23   for the exam, diagnosis, procedures, and orders are all accurate and complete.    Alfredia Ferguson, PA-C  [facility] [phone] (phone) [phone] (fax)  [facility]

## 2023-02-18 ENCOUNTER — Encounter (INDEPENDENT_AMBULATORY_CARE_PROVIDER_SITE_OTHER): Payer: Self-pay

## 2023-03-10 ENCOUNTER — Ambulatory Visit (INDEPENDENT_AMBULATORY_CARE_PROVIDER_SITE_OTHER): Payer: Medicare Other

## 2023-03-10 DIAGNOSIS — E538 Deficiency of other specified B group vitamins: Secondary | ICD-10-CM | POA: Diagnosis not present

## 2023-03-10 MED ORDER — CYANOCOBALAMIN 1000 MCG/ML IJ SOLN
1000.0000 ug | Freq: Once | INTRAMUSCULAR | Status: AC
Start: 2023-03-10 — End: 2023-03-10
  Administered 2023-03-10: 1000 ug via INTRAMUSCULAR

## 2023-03-10 NOTE — Progress Notes (Signed)
Pt here for monthly B12 injection per  Vernona Rieger  B12 given IM, and pt tolerated injection well.  Next B12 injection scheduled for 09/04

## 2023-03-26 ENCOUNTER — Other Ambulatory Visit: Payer: Self-pay | Admitting: Family

## 2023-03-26 DIAGNOSIS — E039 Hypothyroidism, unspecified: Secondary | ICD-10-CM

## 2023-03-29 ENCOUNTER — Other Ambulatory Visit: Payer: Self-pay | Admitting: Family

## 2023-03-29 DIAGNOSIS — I10 Essential (primary) hypertension: Secondary | ICD-10-CM

## 2023-03-29 DIAGNOSIS — R609 Edema, unspecified: Secondary | ICD-10-CM

## 2023-04-09 ENCOUNTER — Ambulatory Visit (INDEPENDENT_AMBULATORY_CARE_PROVIDER_SITE_OTHER): Payer: Medicare Other | Admitting: *Deleted

## 2023-04-09 DIAGNOSIS — E538 Deficiency of other specified B group vitamins: Secondary | ICD-10-CM | POA: Diagnosis not present

## 2023-04-09 MED ORDER — CYANOCOBALAMIN 1000 MCG/ML IJ SOLN
1000.0000 ug | Freq: Once | INTRAMUSCULAR | Status: AC
Start: 2023-04-09 — End: 2023-04-09
  Administered 2023-04-09: 1000 ug via INTRAMUSCULAR

## 2023-04-09 NOTE — Progress Notes (Signed)
Pt here for monthly B12 injection per  Vernona Rieger   B12 given left deltoid IM, and pt tolerated injection well.   Next B12 injection scheduled for 09/04

## 2023-04-16 ENCOUNTER — Telehealth: Payer: Self-pay | Admitting: Family

## 2023-04-16 ENCOUNTER — Other Ambulatory Visit: Payer: Self-pay | Admitting: Family

## 2023-04-16 MED ORDER — TRAMADOL HCL 50 MG PO TABS: ORAL_TABLET | ORAL | 0 refills | Status: DC

## 2023-04-16 NOTE — Telephone Encounter (Signed)
Patient requesting refill on her Tramadol prescription. Please send to [facility] on [address].   Patient said she only 7 Tramadol pills during her last refill in August so she had split the pills in half. Pt just wanted provider to be aware of that.

## 2023-04-20 NOTE — Telephone Encounter (Signed)
Spoke with pts pharmacy, pharmacy staff states pt was only given 7 tablets in August and September due to insurance only covering for 7. Currently pt has a 30 day supply ready for pick up.

## 2023-05-11 ENCOUNTER — Ambulatory Visit (INDEPENDENT_AMBULATORY_CARE_PROVIDER_SITE_OTHER): Payer: Medicare Other

## 2023-05-11 DIAGNOSIS — E538 Deficiency of other specified B group vitamins: Secondary | ICD-10-CM | POA: Diagnosis not present

## 2023-05-11 MED ORDER — CYANOCOBALAMIN 1000 MCG/ML IJ SOLN
1000.0000 ug | Freq: Once | INTRAMUSCULAR | Status: AC
Start: 2023-05-11 — End: 2023-05-11
  Administered 2023-05-11: 1000 ug via INTRAMUSCULAR

## 2023-05-11 NOTE — Progress Notes (Signed)
Gavrielle Renninger is a 77 y.o. female presents to the office today for Monthly B12 injection, per physician's orders. Original order: 02/04/23: B12 is WNL Cyanocobalamin 1000 mg/ml IM was administered L deltoid today. Patient tolerated injection. Patient due for follow up labs/provider appt: Can not tell based on OV notes, Appt made No Patient next injection due: 1 month, appt made Yes  Creft, Melton Alar L

## 2023-05-12 ENCOUNTER — Other Ambulatory Visit: Payer: Self-pay | Admitting: Family

## 2023-05-12 DIAGNOSIS — R609 Edema, unspecified: Secondary | ICD-10-CM

## 2023-05-12 DIAGNOSIS — I10 Essential (primary) hypertension: Secondary | ICD-10-CM

## 2023-06-10 ENCOUNTER — Other Ambulatory Visit: Payer: Self-pay | Admitting: Family

## 2023-06-10 ENCOUNTER — Other Ambulatory Visit (INDEPENDENT_AMBULATORY_CARE_PROVIDER_SITE_OTHER): Payer: Medicare Other

## 2023-06-10 ENCOUNTER — Ambulatory Visit (INDEPENDENT_AMBULATORY_CARE_PROVIDER_SITE_OTHER): Payer: Medicare Other

## 2023-06-10 DIAGNOSIS — E039 Hypothyroidism, unspecified: Secondary | ICD-10-CM

## 2023-06-10 DIAGNOSIS — E538 Deficiency of other specified B group vitamins: Secondary | ICD-10-CM

## 2023-06-10 LAB — TSH: TSH: 7.38 u[IU]/mL — ABNORMAL HIGH (ref 0.35–5.50)

## 2023-06-10 MED ORDER — CYANOCOBALAMIN 1000 MCG/ML IJ SOLN
1000.0000 ug | Freq: Once | INTRAMUSCULAR | Status: AC
Start: 2023-06-10 — End: 2023-06-10
  Administered 2023-06-10: 1000 ug via INTRAMUSCULAR

## 2023-06-10 NOTE — Progress Notes (Signed)
Dorothy Ritter is a 77 y.o. female presents to the office today for Monthly B12 injection, per physician's orders. Original order: 02/04/23: B12 is WNL Cyanocobalamin 1000 mg/ml IM was administered L Deltoid today. Patient tolerated injection. Patient due for follow up labs/provider appt: Can not tell based on OV notes, Appt made No Patient next injection due: 1 month, appt made Yes   Schedule OV in 2 months per LM. appt made Yes TSH as well today.   Creft, Feliberto Harts

## 2023-06-11 IMAGING — MG MM DIGITAL SCREENING BILAT W/ TOMO AND CAD
6 of 10 series · 6 of 30 positions shown · non-contrast
Comparison: Previous exam(s).

ACR Breast Density Category a: The breast tissue is almost entirely
fatty.

CLINICAL DATA: Screening.

EXAM:
DIGITAL SCREENING BILATERAL MAMMOGRAM WITH TOMOSYNTHESIS AND CAD
TECHNIQUE: Bilateral screening digital craniocaudal and mediolateral oblique
mammograms were obtained. Bilateral screening digital breast
tomosynthesis was performed. The images were evaluated with
computer-aided detection.

[L MLO synth-2D (1 of 2)]
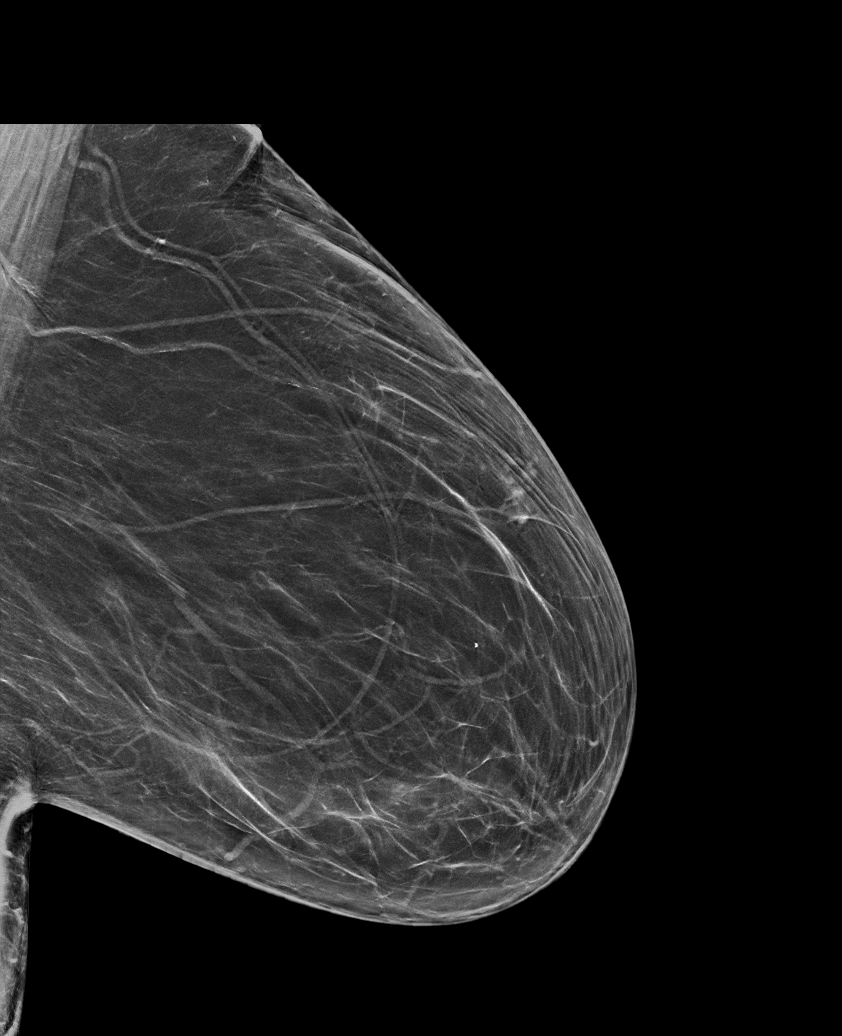

[R MLO synth-2D]
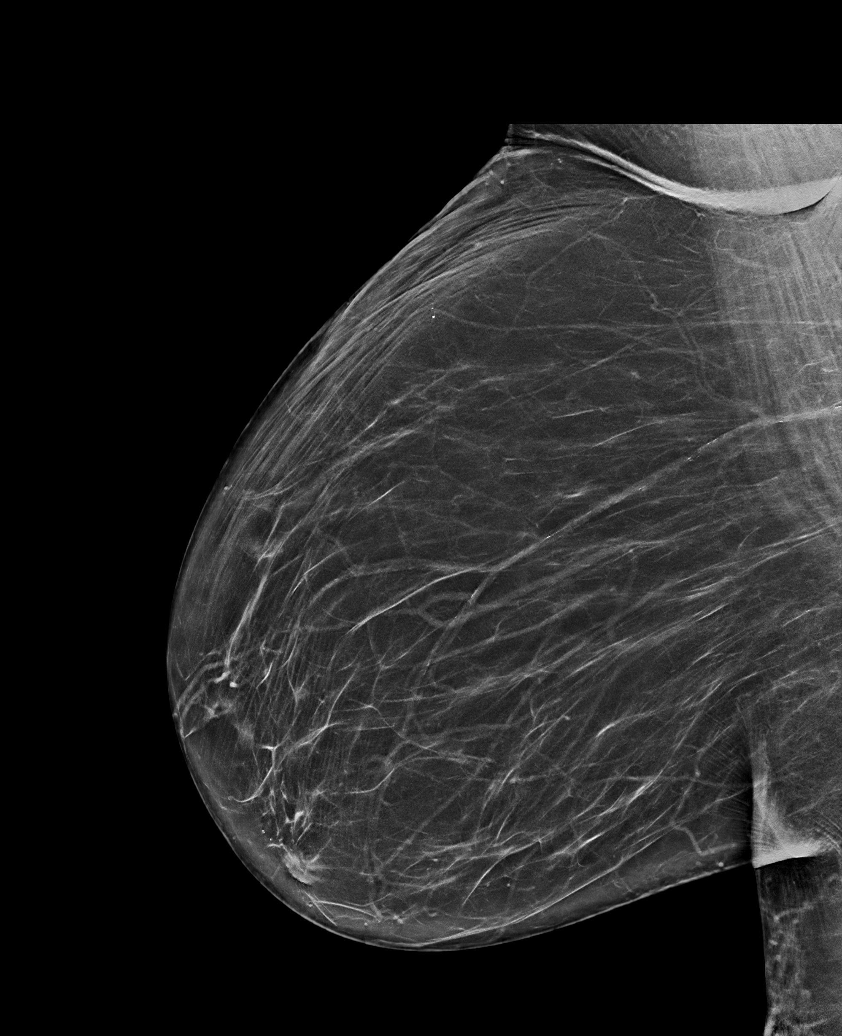

[L CC synth-2D]
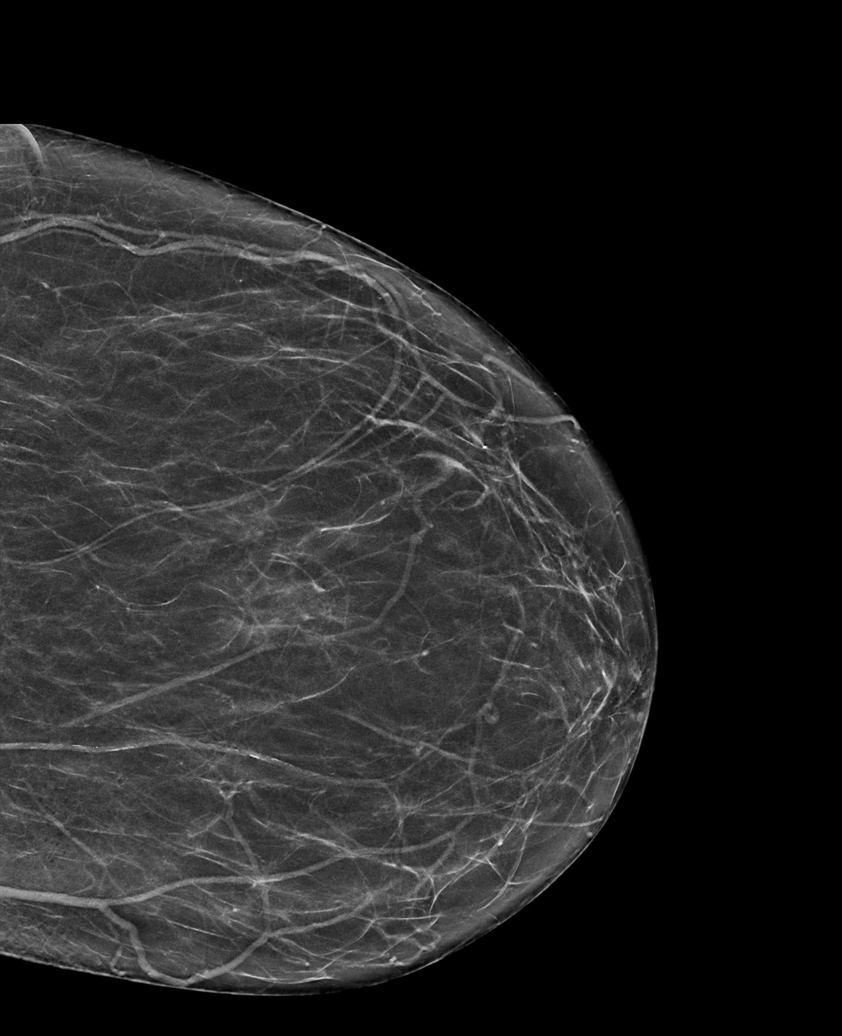

[L MLO synth-2D (2 of 2)]
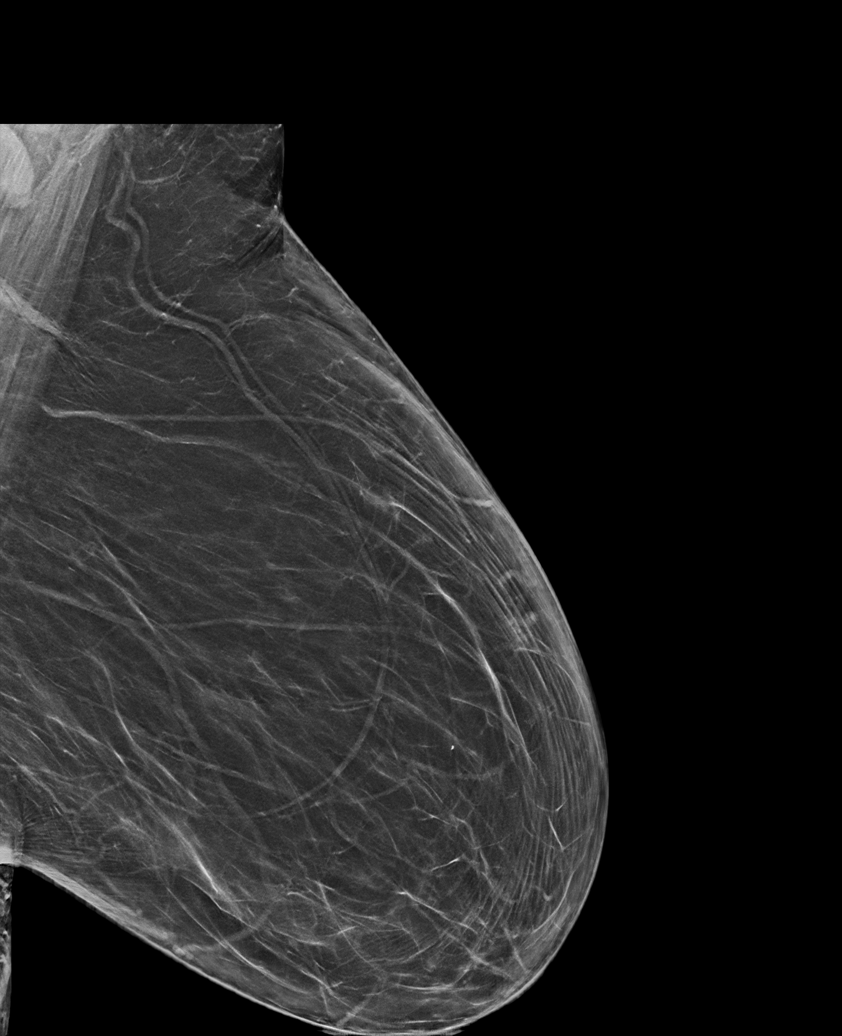

[R CC synth-2D]
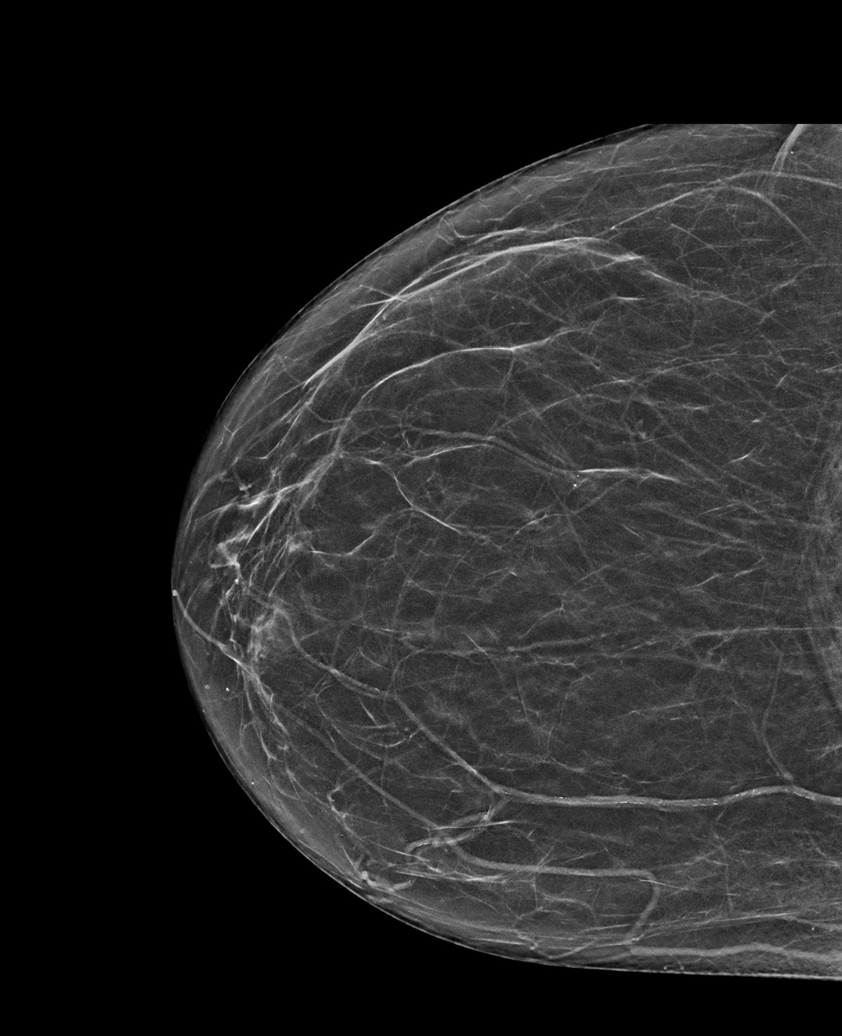

[R CC tomo · tomo slice 32/63.0]
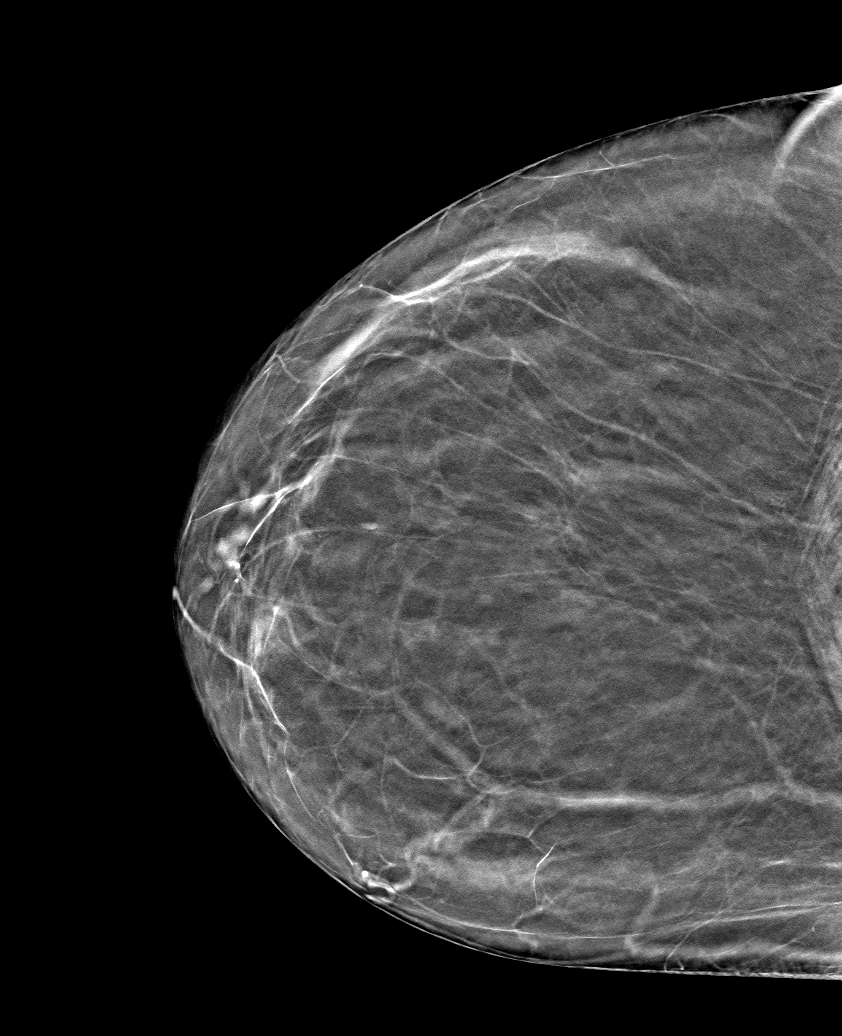

[6 of 30 positions shown; findings below may reference images not displayed]

FINDINGS: There are no findings suspicious for malignancy.
IMPRESSION: No mammographic evidence of malignancy. A result letter of this
screening mammogram will be mailed directly to the patient.

RECOMMENDATION:
Screening mammogram in one year. (Code:0E-3-N98)

BI-RADS CATEGORY  1: Negative.

## 2023-06-14 ENCOUNTER — Other Ambulatory Visit: Payer: Self-pay | Admitting: Family

## 2023-06-14 DIAGNOSIS — E039 Hypothyroidism, unspecified: Secondary | ICD-10-CM

## 2023-06-14 MED ORDER — LEVOTHYROXINE SODIUM 125 MCG PO TABS: 125.0000 ug | ORAL_TABLET | Freq: Every day | ORAL | 0 refills | Status: DC

## 2023-07-01 ENCOUNTER — Ambulatory Visit: Payer: Medicare Other

## 2023-07-01 ENCOUNTER — Telehealth: Payer: Self-pay

## 2023-07-01 VITALS — Ht 65.0 in | Wt 210.0 lb

## 2023-07-01 DIAGNOSIS — Z0000 Encounter for general adult medical examination without abnormal findings: Secondary | ICD-10-CM
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
## 2023-07-01 NOTE — Telephone Encounter (Signed)
Copied from CRM [id number]. Topic: Clinical - Medical Advice >> Jul 01, 2023  9:06 AM [name] wrote: Reason for CRM: Patient states she has a scratchy throat and slight headache since yesterday, would like to know if she can take something over the counter. Patient states she's on various medications and doesn't want to take something that might interact with them.  Dorothy Ritter [phone]

## 2023-07-01 NOTE — Patient Instructions (Addendum)
Dorothy Ritter , Thank you for taking time to come for your Medicare Wellness Visit. I appreciate your ongoing commitment to your health goals. Please review the following plan we discussed and let me know if I can assist you in the future.   Referrals/Orders/Follow-Ups/Clinician Recommendations:   This is a list of the screening recommended for you and due dates:  Health Maintenance  Topic Date Due   Zoster (Shingles) Vaccine (1 of 2) Never done   Flu Shot  02/04/2023   COVID-19 Vaccine (3 - 2024-25 season) 03/07/2023   Medicare Annual Wellness Visit  06/30/2024   DTaP/Tdap/Td vaccine (2 - Td or Tdap) 12/22/2025   Pneumonia Vaccine  Completed   DEXA scan (bone density measurement)  Completed   Hepatitis C Screening  Completed   HPV Vaccine  Aged Out   Cologuard (Stool DNA test)  Discontinued   Opioid Pain Medicine Management Opioids are powerful medicines that are used to treat moderate to severe pain. When used for short periods of time, they can help you to: Sleep better. Do better in physical or occupational therapy. Feel better in the first few days after an injury. Recover from surgery. Opioids should be taken with the supervision of a trained health care provider. They should be taken for the shortest period of time possible. This is because opioids can be addictive, and the longer you take opioids, the greater your risk of addiction. This addiction can also be called opioid use disorder. What are the risks? Using opioid pain medicines for longer than 3 days increases your risk of side effects. Side effects include: Constipation. Nausea and vomiting. Breathing difficulties (respiratory depression). Drowsiness. Confusion. Opioid use disorder. Itching. Taking opioid pain medicine for a long period of time can affect your ability to do daily tasks. It also puts you at risk for: Motor vehicle crashes. Depression. Suicide. Heart attack. Overdose, which can be  life-threatening. What is a pain treatment plan? A pain treatment plan is an agreement between you and your health care provider. Pain is unique to each person, and treatments vary depending on your condition. To manage your pain, you and your health care provider need to work together. To help you do this: Discuss the goals of your treatment, including how much pain you might expect to have and how you will manage the pain. Review the risks and benefits of taking opioid medicines. Remember that a good treatment plan uses more than one approach and minimizes the chance of side effects. Be honest about the amount of medicines you take and about any drug or alcohol use. Get pain medicine prescriptions from only one health care provider. Pain can be managed with many types of alternative treatments. Ask your health care provider to refer you to one or more specialists who can help you manage pain through: Physical or occupational therapy. Counseling (cognitive behavioral therapy). Good nutrition. Biofeedback. Massage. Meditation. Non-opioid medicine. Following a gentle exercise program. How to use opioid pain medicine Taking medicine Take your pain medicine exactly as told by your health care provider. Take it only when you need it. If your pain gets less severe, you may take less than your prescribed dose if your health care provider approves. If you are not having pain, do nottake pain medicine unless your health care provider tells you to take it. If your pain is severe, do nottry to treat it yourself by taking more pills than instructed on your prescription. Contact your health care provider for help. Write  down the times when you take your pain medicine. It is easy to become confused while on pain medicine. Writing the time can help you avoid overdose. Take other over-the-counter or prescription medicines only as told by your health care provider. Keeping yourself and others safe  While  you are taking opioid pain medicine: Do not drive, use machinery, or power tools. Do not sign legal documents. Do not drink alcohol. Do not take sleeping pills. Do not supervise children by yourself. Do not do activities that require climbing or being in high places. Do not go to a lake, river, ocean, spa, or swimming pool. Do not share your pain medicine with anyone. Keep pain medicine in a locked cabinet or in a secure area where pets and children cannot reach it. Stopping your use of opioids If you have been taking opioid medicine for more than a few weeks, you may need to slowly decrease (taper) how much you take until you stop completely. Tapering your use of opioids can decrease your risk of symptoms of withdrawal, such as: Pain and cramping in the abdomen. Nausea. Sweating. Sleepiness. Restlessness. Uncontrollable shaking (tremors). Cravings for the medicine. Do not attempt to taper your use of opioids on your own. Talk with your health care provider about how to do this. Your health care provider may prescribe a step-down schedule based on how much medicine you are taking and how long you have been taking it. Getting rid of leftover pills Do not save any leftover pills. Get rid of leftover pills safely by: Taking the medicine to a prescription take-back program. This is usually offered by the county or law enforcement. Bringing them to a pharmacy that has a drug disposal container. Flushing them down the toilet. Check the label or package insert of your medicine to see whether this is safe to do. Throwing them out in the trash. Check the label or package insert of your medicine to see whether this is safe to do. If it is safe to throw it out, remove the medicine from the original container, put it into a sealable bag or container, and mix it with used coffee grounds, food scraps, dirt, or cat litter before putting it in the trash. Follow these instructions at home: Activity Do  exercises as told by your health care provider. Avoid activities that make your pain worse. Return to your normal activities as told by your health care provider. Ask your health care provider what activities are safe for you. General instructions You may need to take these actions to prevent or treat constipation: Drink enough fluid to keep your urine pale yellow. Take over-the-counter or prescription medicines. Eat foods that are high in fiber, such as beans, whole grains, and fresh fruits and vegetables. Limit foods that are high in fat and processed sugars, such as fried or sweet foods. Keep all follow-up visits. This is important. Where to find support If you have been taking opioids for a long time, you may benefit from receiving support for quitting from a local support group or counselor. Ask your health care provider for a referral to these resources in your area. Where to find more information Centers for Disease Control and Prevention (CDC): FootballExhibition.com.br [place] Food and Drug Administration (FDA): PumpkinSearch.com.ee Get help right away if: You may have taken too much of an opioid (overdosed). Common symptoms of an overdose: Your breathing is slower or more shallow than normal. You have a very slow heartbeat (pulse). You have slurred speech. You have  nausea and vomiting. Your pupils become very small. You have other potential symptoms: You are very confused. You faint or feel like you will faint. You have cold, clammy skin. You have blue lips or fingernails. You have thoughts of harming yourself or harming others. These symptoms may represent a serious problem that is an emergency. Do not wait to see if the symptoms will go away. Get medical help right away. Call your local emergency services (911 in the [place] Do not drive yourself to the hospital.  If you ever feel like you may hurt yourself or others, or have thoughts about taking your own life, get help right away. Go to your nearest  emergency department or: Call your local emergency services (911 in the [place] Call the [facility] (386-479-3625 in the [place] Call a suicide crisis helpline, such as the National Suicide Prevention Lifeline at 531-221-7320 or 988 in the [place] This is open 24 hours a day in the [place] Text the Crisis Text Line at 709 125 6580 (in the [place] Summary Opioid medicines can help you manage moderate to severe pain for a short period of time. A pain treatment plan is an agreement between you and your health care provider. Discuss the goals of your treatment, including how much pain you might expect to have and how you will manage the pain. If you think that you or someone else may have taken too much of an opioid, get medical help right away. This information is not intended to replace advice given to you by your health care provider. Make sure you discuss any questions you have with your health care provider. Document Revised: 01/15/2021 Document Reviewed: 10/02/2020 Elsevier Patient Education  2024 [employer] Inc.  Advanced directives: (Declined) Advance directive discussed with you today. Even though you declined this today, please call our office should you change your mind, and we can give you the proper paperwork for you to fill out.  Next Medicare Annual Wellness Visit scheduled for next year: Yes

## 2023-07-02 ENCOUNTER — Ambulatory Visit (INDEPENDENT_AMBULATORY_CARE_PROVIDER_SITE_OTHER): Payer: Medicare Other | Admitting: Family

## 2023-07-02 ENCOUNTER — Encounter: Payer: Self-pay | Admitting: Family

## 2023-07-02 VITALS — BP 136/82 | HR 75 | Temp 98.0°F | Ht 65.0 in | Wt 208.0 lb

## 2023-07-02 DIAGNOSIS — J209 Acute bronchitis, unspecified: Secondary | ICD-10-CM

## 2023-07-02 DIAGNOSIS — M5416 Radiculopathy, lumbar region: Secondary | ICD-10-CM | POA: Diagnosis not present

## 2023-07-02 MED ORDER — TRAMADOL HCL 50 MG PO TABS: ORAL_TABLET | ORAL | 0 refills | Status: DC

## 2023-07-02 MED ORDER — AZITHROMYCIN 250 MG PO TABS: ORAL_TABLET | ORAL | 0 refills | Status: DC

## 2023-07-02 MED ORDER — BENZONATATE 100 MG PO CAPS
100.0000 mg | ORAL_CAPSULE | Freq: Two times a day (BID) | ORAL | 1 refills | Status: DC | PRN
Start: 2023-07-02 — End: 2023-08-13

## 2023-07-02 NOTE — Telephone Encounter (Signed)
Pt has appointment with PCP 07/02/2023.

## 2023-07-02 NOTE — Telephone Encounter (Signed)
Copied from CRM [id number]. Topic: Appointments - Appointment Scheduling >> Jul 02, 2023 12:48 PM [name] wrote: Patient/patient representative is calling to schedule an appointment. Refer to attachments for appointment information. Patient called to request medication to be called in by provider for congestion, cough, and sinus. Advised patient that providers like to see them before prescribing anything for cold/sinus symptoms.
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
## 2023-07-13 ENCOUNTER — Ambulatory Visit: Payer: Medicare Other

## 2023-07-13 NOTE — Progress Notes (Deleted)
 Dorothy Ritter is a 78 y.o. female presents to the office today for Monthly B12 injection, per physician's orders. Original order: 02/04/23: B12 is WNL Cyanocobalamin  1000 mg/ml IM was administered *** Deltoid today. Patient tolerated injection. Patient due for follow up labs/provider appt: Yes.  Appt made: Appt is pending for 08/13/23 Patient next injection due: 1 month, appt made: No- Appt is pending for 08/13/23  Creft, Candelaria L

## 2023-08-10 ENCOUNTER — Other Ambulatory Visit: Payer: Self-pay | Admitting: Family

## 2023-08-10 DIAGNOSIS — E785 Hyperlipidemia, unspecified: Secondary | ICD-10-CM

## 2023-08-13 ENCOUNTER — Encounter: Payer: Self-pay | Admitting: Family

## 2023-08-13 ENCOUNTER — Ambulatory Visit (INDEPENDENT_AMBULATORY_CARE_PROVIDER_SITE_OTHER): Payer: Medicare Other | Admitting: Family

## 2023-08-13 VITALS — BP 138/86 | HR 75 | Ht 65.0 in | Wt 205.0 lb

## 2023-08-13 DIAGNOSIS — M545 Low back pain, unspecified: Secondary | ICD-10-CM

## 2023-08-13 DIAGNOSIS — E538 Deficiency of other specified B group vitamins: Secondary | ICD-10-CM

## 2023-08-13 DIAGNOSIS — Z1231 Encounter for screening mammogram for malignant neoplasm of breast: Secondary | ICD-10-CM

## 2023-08-13 DIAGNOSIS — I10 Essential (primary) hypertension: Secondary | ICD-10-CM

## 2023-08-13 DIAGNOSIS — E785 Hyperlipidemia, unspecified: Secondary | ICD-10-CM | POA: Diagnosis not present

## 2023-08-13 DIAGNOSIS — G8929 Other chronic pain: Secondary | ICD-10-CM

## 2023-08-13 DIAGNOSIS — E039 Hypothyroidism, unspecified: Secondary | ICD-10-CM

## 2023-08-13 LAB — LIPID PANEL
Cholesterol: 143 mg/dL (ref 0–200)
HDL: 61.5 mg/dL (ref >39.00)
LDL Cholesterol: 68 mg/dL (ref 0–99)
NonHDL: 81.44
Total CHOL/HDL Ratio: 2
Triglycerides: 69 mg/dL (ref 0.0–149.0)
VLDL: 13.8 mg/dL (ref 0.0–40.0)

## 2023-08-13 LAB — CBC WITH DIFFERENTIAL/PLATELET
Basophils Absolute: 0.1 10*3/uL (ref 0.0–0.1)
Basophils Relative: 1.3 % (ref 0.0–3.0)
Eosinophils Absolute: 0.2 10*3/uL (ref 0.0–0.7)
Eosinophils Relative: 2.9 % (ref 0.0–5.0)
HCT: 36.2 % (ref 36.0–46.0)
Hemoglobin: 11.9 g/dL — ABNORMAL LOW (ref 12.0–15.0)
Lymphocytes Relative: 37.4 % (ref 12.0–46.0)
Lymphs Abs: 2.5 10*3/uL (ref 0.7–4.0)
MCHC: 32.9 g/dL (ref 30.0–36.0)
MCV: 95.5 fL (ref 78.0–100.0)
Monocytes Absolute: 0.6 10*3/uL (ref 0.1–1.0)
Monocytes Relative: 8.9 % (ref 3.0–12.0)
Neutro Abs: 3.3 10*3/uL (ref 1.4–7.7)
Neutrophils Relative %: 49.5 % (ref 43.0–77.0)
Platelets: 357 10*3/uL (ref 150.0–400.0)
RBC: 3.79 Mil/uL — ABNORMAL LOW (ref 3.87–5.11)
RDW: 14 % (ref 11.5–15.5)
WBC: 6.8 10*3/uL (ref 4.0–10.5)

## 2023-08-13 LAB — COMPREHENSIVE METABOLIC PANEL WITH GFR
ALT: 9 U/L (ref 0–35)
AST: 14 U/L (ref 0–37)
Albumin: 3.7 g/dL (ref 3.5–5.2)
Alkaline Phosphatase: 68 U/L (ref 39–117)
BUN: 17 mg/dL (ref 6–23)
CO2: 28 meq/L (ref 19–32)
Calcium: 8.9 mg/dL (ref 8.4–10.5)
Chloride: 104 meq/L (ref 96–112)
Creatinine, Ser: 0.79 mg/dL (ref 0.40–1.20)
GFR: 71.53 mL/min (ref >60.00)
Glucose, Bld: 94 mg/dL (ref 70–99)
Potassium: 3.9 meq/L (ref 3.5–5.1)
Sodium: 143 meq/L (ref 135–145)
Total Bilirubin: 0.5 mg/dL (ref 0.2–1.2)
Total Protein: 6.8 g/dL (ref 6.0–8.3)

## 2023-08-13 LAB — TSH: TSH: 0.57 u[IU]/mL (ref 0.35–5.50)

## 2023-08-13 MED ORDER — CYANOCOBALAMIN 1000 MCG/ML IJ SOLN
1000.0000 ug | Freq: Once | INTRAMUSCULAR | Status: AC
  Administered 2023-08-13: 1000 ug via INTRAMUSCULAR

## 2023-08-13 MED ORDER — TRAMADOL HCL 50 MG PO TABS: 50.0000 mg | ORAL_TABLET | Freq: Three times a day (TID) | ORAL | 0 refills | Status: AC | PRN
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
## 2023-08-23 ENCOUNTER — Telehealth: Payer: Self-pay

## 2023-08-23 ENCOUNTER — Encounter: Payer: Medicare Other | Admitting: Medical

## 2023-08-23 ENCOUNTER — Ambulatory Visit: Payer: Self-pay | Admitting: Family

## 2023-08-23 NOTE — Telephone Encounter (Signed)
Initial Comment Caller states she is nauseated with diarrhea. Pt has urinated within the last eight hours. Translation No Disp. Time Lamount Cohen Time) Disposition Final User 08/22/2023 2:21:02 AM FINAL ATTEMPT MADE - no message left [place], RNMorrie Sheldon 08/22/2023 2:21:12 AM Send to RN Final Attempt Modesta Messing, RN, Morrie Sheldon 08/22/2023 5:08:51 AM Attempt made - no message left Mayford Knife, RN, Teresa Coombs 08/22/2023 5:25:10 AM FINAL ATTEMPT MADE - message left Yes Mayford Knife, RN, Teresa Coombs Final Disposition 08/22/2023 5:25:10 AM FINAL ATTEMPT MADE - message left Yes Mayford Knife, RN, Teresa Coombs Comments User: Barbette Reichmann, RN Date/Time Lamount Cohen Time): 08/22/2023 2:20:25 AM Primary number is incorrect, it is a phone number for a business. Got secondary number from previous records

## 2023-08-23 NOTE — Progress Notes (Signed)
 This encounter was created in error - please disregard.

## 2023-08-23 NOTE — Telephone Encounter (Signed)
 Appt scheduled 08/23/23

## 2023-08-23 NOTE — Telephone Encounter (Signed)
Copied From CRM [id number]. Reason for Triage: Patient called says she has a cold and stuffy head/mucus - would like something sent to her pharmacy     Chief Complaint: Cough Symptoms: Cough, runny nose, mild difficulty breathing, chest burning when coughing  Frequency: Frequent  Disposition: [] ED /[] Urgent Care (no appt availability in office) / [x] Appointment(In office/virtual)/ []  [facility]/ [] Home Care/ [] Refused Recommended Disposition /[] [facility]/ []  Follow-up with PCP Additional Notes: Patient reports that Saturday she began to experience a cough and runny nose. She states that the cough has been productive of dark cloudy sputum. She states she is also experiencing a runny nose, mild difficulty breathing, and a burning in her chest when coughing. Appointment made for the patient later today in the office.    Reason for Disposition  [1] MILD difficulty breathing (e.g., minimal/no SOB at rest, SOB with walking, pulse <100) AND [2] still present when not coughing  Answer Assessment - Initial Assessment Questions 1. ONSET: "When did the cough begin?"      2 days ago  2. SEVERITY: "How bad is the cough today?"      Mild to moderate  3. SPUTUM: "Describe the color of your sputum" (none, dry cough; clear, white, yellow, green)     Dark cloudy  4. HEMOPTYSIS: "Are you coughing up any blood?" If so ask: "How much?" (flecks, streaks, tablespoons, etc.)     No 5. DIFFICULTY BREATHING: "Are you having difficulty breathing?" If Yes, ask: "How bad is it?" (e.g., mild, moderate, severe)    - MILD: No SOB at rest, mild SOB with walking, speaks normally in sentences, can lie down, no retractions, pulse < 100.    - MODERATE: SOB at rest, SOB with minimal exertion and prefers to sit, cannot lie down flat, speaks in phrases, mild retractions, audible wheezing, pulse 100-120.    - SEVERE: Very SOB at rest, speaks in single words, struggling to breathe, sitting hunched forward,  retractions, pulse > 120      Mild 6. FEVER: "Do you have a fever?" If Yes, ask: "What is your temperature, how was it measured, and when did it start?"     Felt warm yesterday  7. CARDIAC HISTORY: "Do you have any history of heart disease?" (e.g., heart attack, congestive heart failure)      No 8. LUNG HISTORY: "Do you have any history of lung disease?"  (e.g., pulmonary embolus, asthma, emphysema)     No 9. PE RISK FACTORS: "Do you have a history of blood clots?" (or: recent major surgery, recent prolonged travel, bedridden)     No 10. OTHER SYMPTOMS: "Do you have any other symptoms?" (e.g., runny nose, wheezing, chest pain)       Runny nose 11. PREGNANCY: "Is there any chance you are pregnant?" "When was your last menstrual period?"       No 12. TRAVEL: "Have you traveled out of the country in the last month?" (e.g., travel history, exposures)       No  Protocols used: Cough - Acute Productive-A-AH

## 2023-08-23 NOTE — Telephone Encounter (Addendum)
This RN second attempt to contact patient for triage. No answer, voicemail left requesting return call to clinic.   This RN first attempt to contact patient for triage. No answer, voicemail left requesting return call to clinic.

## 2023-09-27 ENCOUNTER — Other Ambulatory Visit: Payer: Self-pay | Admitting: Family

## 2023-09-27 ENCOUNTER — Ambulatory Visit

## 2023-09-27 DIAGNOSIS — E039 Hypothyroidism, unspecified: Secondary | ICD-10-CM

## 2023-09-27 NOTE — Progress Notes (Deleted)
 Patient is here for a vitamin B12 injection per physician orders: 02/04/23: B12 is WNL  Last monthly injection 08/12/2022.  Denies gastrointestinal problems or dizziness.   B12 injection to *** deltoid with no apparent complications.  Schedule next injection in one month: ***.

## 2023-09-30 ENCOUNTER — Telehealth: Payer: Self-pay | Admitting: Neurology

## 2023-09-30 ENCOUNTER — Ambulatory Visit (INDEPENDENT_AMBULATORY_CARE_PROVIDER_SITE_OTHER): Admitting: Neurology

## 2023-09-30 DIAGNOSIS — Z0184 Encounter for antibody response examination: Secondary | ICD-10-CM

## 2023-09-30 DIAGNOSIS — E538 Deficiency of other specified B group vitamins: Secondary | ICD-10-CM

## 2023-09-30 MED ORDER — CYANOCOBALAMIN 1000 MCG/ML IJ SOLN
1000.0000 ug | Freq: Once | INTRAMUSCULAR | Status: AC
Start: 2023-09-30 — End: 2023-09-30
  Administered 2023-09-30: 1000 ug via INTRAMUSCULAR

## 2023-09-30 NOTE — Progress Notes (Signed)
 Patient is here for a vitamin B12 injection per orders from [facility]:  02/04/23: B12 is WNL  Last injection: 08/13/2023 Denies gastrointestinal problems or dizziness.  B12 injection to right deltoid with no apparent complications.  Scheduled next injection in one month: 11/02/2023.

## 2023-09-30 NOTE — Telephone Encounter (Signed)
 Called patient. LVM for her to call back to schedule lab appt and let us know if she wants to come here for lab or go to [address]. Awaiting call back.

## 2023-09-30 NOTE — Telephone Encounter (Signed)
 Patient came today for B12 injection.   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 She would like to get this through the lab. Okay to order Quantiferon testing?

## 2023-10-01 NOTE — Telephone Encounter (Signed)
 Signed order for our clinic. Will await call back for patient to schedule lab. She was made aware during nurse visit she needed to call and schedule. She did not want to have done yesterday due to "someone in the car waiting on her".

## 2023-10-02 ENCOUNTER — Other Ambulatory Visit: Payer: Self-pay | Admitting: Family

## 2023-10-04 NOTE — Addendum Note (Signed)
 Addended by: Wilford Corner on: 10/04/2023 11:42 AM   Modules accepted: Orders

## 2023-10-06 ENCOUNTER — Ambulatory Visit
Admission: RE | Admit: 2023-10-06 | Discharge: 2023-10-06 | Disposition: A | Discharge status: Home / Self Care | Source: Ambulatory Visit | Attending: Family | Admitting: Family

## 2023-10-06 ENCOUNTER — Other Ambulatory Visit: Payer: Self-pay | Admitting: Family

## 2023-10-06 DIAGNOSIS — Z1231 Encounter for screening mammogram for malignant neoplasm of breast: Secondary | ICD-10-CM | POA: Diagnosis not present

## 2023-10-06 DIAGNOSIS — R609 Edema, unspecified: Secondary | ICD-10-CM

## 2023-10-06 DIAGNOSIS — I10 Essential (primary) hypertension: Secondary | ICD-10-CM

## 2023-10-13 ENCOUNTER — Other Ambulatory Visit

## 2023-10-13 DIAGNOSIS — Z0184 Encounter for antibody response examination: Secondary | ICD-10-CM | POA: Diagnosis not present

## 2023-10-16 LAB — QUANTIFERON-TB GOLD PLUS
Mitogen-NIL: 4.89 [IU]/mL
NIL: 0.1 [IU]/mL
QuantiFERON-TB Gold Plus: NEGATIVE
TB1-NIL: 0.02 [IU]/mL
TB2-NIL: 0.01 [IU]/mL

## 2023-10-28 ENCOUNTER — Telehealth: Payer: Self-pay | Admitting: Family

## 2023-10-28 NOTE — Telephone Encounter (Signed)
 Copied from CRM [id number]. Topic: Medical Record Request - Other >> Oct 28, 2023  1:08 PM Alyse July wrote: Reason for CRM: Patient would like a copy of her most recent lab results mailed to her. Address confirmed.

## 2023-10-29 NOTE — Telephone Encounter (Signed)
 Pts results have been printed out and placed up front to mail out to pt.

## 2023-11-01 ENCOUNTER — Ambulatory Visit

## 2023-11-02 ENCOUNTER — Other Ambulatory Visit: Payer: Self-pay | Admitting: Family

## 2023-11-02 ENCOUNTER — Telehealth: Payer: Self-pay

## 2023-11-02 ENCOUNTER — Ambulatory Visit (INDEPENDENT_AMBULATORY_CARE_PROVIDER_SITE_OTHER)

## 2023-11-02 DIAGNOSIS — E538 Deficiency of other specified B group vitamins: Secondary | ICD-10-CM

## 2023-11-02 MED ORDER — CYANOCOBALAMIN 1000 MCG/ML IJ SOLN
1000.0000 ug | Freq: Once | INTRAMUSCULAR | Status: AC
  Administered 2023-11-02: 1000 ug via INTRAMUSCULAR

## 2023-11-02 MED ORDER — TRAMADOL HCL 50 MG PO TABS: ORAL_TABLET | ORAL | 0 refills | Status: DC

## 2023-11-02 NOTE — Progress Notes (Signed)
 Dorothy Ritter is a 78 y.o. female presents to the office today for Monthly B12 injection per physician's orders. Original order: "Her B12 is fine, continue monthly injections." Cyanocobalamin  1000 mcg/ml IM was administered L Deltoid today. Patient tolerated injection. Patient due for follow up labs/provider appt: around 02/2024 for 6 month f/u: Appt made No- will schedule closer to time.  Patient next injection due: 1 month, appt made Yes  Creft, Francena Infield L

## 2023-11-02 NOTE — Telephone Encounter (Signed)
 When in office for NV today, pt asked for a refill on Tramadol .   Requesting: Tramadol  50 mg Contract: 09/10/2022 UDS: none Last Visit: 08/13/23 Next Visit: none pending- due in August 2025 Last Refill: 10/05/2023  Please Advise  Pharmacy: [id number], [place] - [address]  [address], [place] [address]

## 2023-11-08 ENCOUNTER — Other Ambulatory Visit: Payer: Self-pay | Admitting: Family

## 2023-11-30 ENCOUNTER — Other Ambulatory Visit: Payer: Self-pay | Admitting: Family

## 2023-12-01 NOTE — Telephone Encounter (Signed)
 Requesting: tramadol  50mg   Contract: 09/10/22 UDS: None Last Visit: 08/13/23 Next Visit: None Last Refill: 11/02/23 #30 and 0RF   Please Advise

## 2023-12-02 ENCOUNTER — Ambulatory Visit (INDEPENDENT_AMBULATORY_CARE_PROVIDER_SITE_OTHER)

## 2023-12-02 DIAGNOSIS — E538 Deficiency of other specified B group vitamins: Secondary | ICD-10-CM

## 2023-12-02 MED ORDER — CYANOCOBALAMIN 1000 MCG/ML IJ SOLN
1000.0000 ug | Freq: Once | INTRAMUSCULAR | Status: AC
  Administered 2023-12-02: 1000 ug via INTRAMUSCULAR

## 2023-12-02 NOTE — Progress Notes (Signed)
 Dorothy Ritter is a 78 y.o. female presents to the office today for Monthly B12 injection per physician's orders. Original order: "Her B12 is fine, continue monthly injections." Cyanocobalamin  1000 mcg/ml IM was administered L Deltoid today. Patient tolerated injection. Patient due for follow up labs/provider appt: around 02/2024 for 6 month f/u: Appt made No- will schedule closer to time.

## 2023-12-20 ENCOUNTER — Other Ambulatory Visit: Payer: Self-pay | Admitting: Family

## 2023-12-20 DIAGNOSIS — R609 Edema, unspecified: Secondary | ICD-10-CM

## 2023-12-20 DIAGNOSIS — I10 Essential (primary) hypertension: Secondary | ICD-10-CM

## 2023-12-30 ENCOUNTER — Ambulatory Visit (INDEPENDENT_AMBULATORY_CARE_PROVIDER_SITE_OTHER)

## 2023-12-30 DIAGNOSIS — E538 Deficiency of other specified B group vitamins: Secondary | ICD-10-CM

## 2023-12-30 MED ORDER — CYANOCOBALAMIN 1000 MCG/ML IJ SOLN
1000.0000 ug | Freq: Once | INTRAMUSCULAR | Status: AC
  Administered 2023-12-30: 1000 ug via INTRAMUSCULAR

## 2023-12-30 NOTE — Progress Notes (Signed)
 Pt here for monthly B12 injection per original order Her B12 is fine, continue monthly injections.   Last B12 injection:12/02/23  Last B12 level:  420 on 02/04/23  B12 1000mcg given IM, and pt tolerated injection well.  Next B12 injection scheduled for: July 24th 2025.

## 2024-01-01 ENCOUNTER — Other Ambulatory Visit: Payer: Self-pay | Admitting: Family

## 2024-01-03 ENCOUNTER — Other Ambulatory Visit: Payer: Self-pay | Admitting: Family

## 2024-01-03 DIAGNOSIS — R609 Edema, unspecified: Secondary | ICD-10-CM

## 2024-01-03 DIAGNOSIS — I10 Essential (primary) hypertension: Secondary | ICD-10-CM

## 2024-01-03 NOTE — Telephone Encounter (Signed)
 Requesting: tramadol  50mg   Contract: None UDS: None Last Visit: 08/13/23 Next Visit: None Last Refill: 12/01/23 #30 and 0RF   Please Advise

## 2024-01-24 ENCOUNTER — Telehealth: Payer: Self-pay

## 2024-01-24 NOTE — Telephone Encounter (Signed)
 Called pt to inform her that her handicap placard has been filled out, was not able to leave a VM due to mailbox being full.

## 2024-01-27 ENCOUNTER — Telehealth: Payer: Self-pay

## 2024-01-27 ENCOUNTER — Ambulatory Visit (INDEPENDENT_AMBULATORY_CARE_PROVIDER_SITE_OTHER)

## 2024-01-27 DIAGNOSIS — E538 Deficiency of other specified B group vitamins: Secondary | ICD-10-CM | POA: Diagnosis not present

## 2024-01-27 MED ORDER — CYANOCOBALAMIN 1000 MCG/ML IJ SOLN
1000.0000 ug | Freq: Once | INTRAMUSCULAR | Status: AC
  Administered 2024-01-27: 1000 ug via INTRAMUSCULAR

## 2024-01-27 NOTE — Progress Notes (Signed)
 Pt here for monthly B12 injection per original order Her B12 is fine, continue monthly injections.    Last B12 injection:12/30/23   Last B12 level:  420 on 02/04/23   B12 1000mcg given R deltoid IM, and pt tolerated injection well.   Next B12 injection scheduled for: 1 month.

## 2024-01-27 NOTE — Telephone Encounter (Signed)
 Pt was in office today 01/27/2024 for a NV and picked up her handicap placard as well.

## 2024-02-04 ENCOUNTER — Other Ambulatory Visit: Payer: Self-pay | Admitting: Family

## 2024-02-17 ENCOUNTER — Other Ambulatory Visit: Payer: Self-pay | Admitting: Family

## 2024-03-02 ENCOUNTER — Ambulatory Visit

## 2024-03-02 DIAGNOSIS — E538 Deficiency of other specified B group vitamins: Secondary | ICD-10-CM

## 2024-03-02 MED ORDER — CYANOCOBALAMIN 1000 MCG/ML IJ SOLN
1000.0000 ug | Freq: Once | INTRAMUSCULAR | Status: AC
  Administered 2024-03-02: 1000 ug via INTRAMUSCULAR

## 2024-03-02 NOTE — Progress Notes (Signed)
 Pt here for monthly B12 injection per original order: Her B12 is fine, continue monthly injections.   Last B12 injection:01/27/24  Last B12 level:  420 on 02/04/23   B12 1000mcg given IM, and pt tolerated injection well.  Next B12 injection scheduled for:  03/31/24.

## 2024-03-31 ENCOUNTER — Ambulatory Visit

## 2024-03-31 DIAGNOSIS — E538 Deficiency of other specified B group vitamins: Secondary | ICD-10-CM

## 2024-03-31 MED ORDER — CYANOCOBALAMIN 1000 MCG/ML IJ SOLN
1000.0000 ug | Freq: Once | INTRAMUSCULAR | Status: AC
  Administered 2024-03-31: 1000 ug via INTRAMUSCULAR

## 2024-03-31 NOTE — Progress Notes (Signed)
 Pt here for monthly B12 injection per former PCP  Last B12 injection: 03/02/2024    B12 1000mcg given IM L deltoid, and pt tolerated injection well.  Next B12 injection scheduled for: pt states she is leaving for vacation next month and would like to call back at a later time to schedule next B12 injection as well as to schedule to establish care with a new PCP.

## 2024-04-10 ENCOUNTER — Other Ambulatory Visit: Payer: Self-pay | Admitting: Family

## 2024-04-10 DIAGNOSIS — E039 Hypothyroidism, unspecified: Secondary | ICD-10-CM

## 2024-04-10 DIAGNOSIS — J209 Acute bronchitis, unspecified: Secondary | ICD-10-CM

## 2024-04-10 DIAGNOSIS — R609 Edema, unspecified: Secondary | ICD-10-CM

## 2024-04-10 DIAGNOSIS — I10 Essential (primary) hypertension: Secondary | ICD-10-CM

## 2024-04-10 DIAGNOSIS — E785 Hyperlipidemia, unspecified: Secondary | ICD-10-CM

## 2024-04-10 MED ORDER — POTASSIUM CHLORIDE ER 8 MEQ PO TBCR: 8.0000 meq | EXTENDED_RELEASE_TABLET | Freq: Every day | ORAL | 0 refills | Status: DC

## 2024-04-10 MED ORDER — VALSARTAN-HYDROCHLOROTHIAZIDE 160-12.5 MG PO TABS: 1.0000 | ORAL_TABLET | Freq: Every day | ORAL | 0 refills | Status: AC

## 2024-04-10 MED ORDER — ALBUTEROL SULFATE HFA 108 (90 BASE) MCG/ACT IN AERS
2.0000 | INHALATION_SPRAY | Freq: Four times a day (QID) | RESPIRATORY_TRACT | 0 refills | Status: AC | PRN
Start: 2024-04-10 — End: ?

## 2024-04-10 MED ORDER — LEVOTHYROXINE SODIUM 125 MCG PO TABS: 125.0000 ug | ORAL_TABLET | Freq: Every day | ORAL | 1 refills | Status: AC

## 2024-04-10 MED ORDER — PRAVASTATIN SODIUM 40 MG PO TABS: 40.0000 mg | ORAL_TABLET | Freq: Every day | ORAL | 2 refills | Status: AC

## 2024-04-10 MED ORDER — TRAMADOL HCL 50 MG PO TABS: ORAL_TABLET | ORAL | 0 refills | Status: DC

## 2024-04-10 NOTE — Telephone Encounter (Unsigned)
 Copied from CRM #[id number]. Topic: Clinical - Medication Refill >> Apr 10, 2024 10:10 AM Corin V wrote: Medication: traMADol  (ULTRAM ) 50 MG tablet valsartan -hydrochlorothiazide  (DIOVAN -HCT) 160-12.5 MG tablet [[id number]] potassium chloride  (KLOR-CON ) 8 MEQ tablet levothyroxine  (SYNTHROID ) 125 MCG tablet pravastatin  (PRAVACHOL ) 40 MG tablet albuterol  (VENTOLIN  HFA) 108 (90 Base) MCG/ACT inhaler   Has the patient contacted their pharmacy? Yes (Agent: If no, request that the patient contact the pharmacy for the refill. If patient does not wish to contact the pharmacy document the reason why and proceed with request.) (Agent: If yes, when and what did the pharmacy advise?)  This is the patient's preferred pharmacy:  [facility] [address] [place] - [address] [place] [address] Phone: [phone] Fax: [phone]  Is this the correct pharmacy for this prescription? Yes If no, delete pharmacy and type the correct one.   Has the prescription been filled recently? No  Is the patient out of the medication? Yes  Has the patient been seen for an appointment in the last year OR does the patient have an upcoming appointment? Yes  Can we respond through MyChart? Yes  Agent: Please be advised that Rx refills may take up to 3 business days. We ask that you follow-up with your pharmacy.

## 2024-06-16 ENCOUNTER — Ambulatory Visit: Admitting: *Deleted

## 2024-06-16 DIAGNOSIS — E538 Deficiency of other specified B group vitamins: Secondary | ICD-10-CM

## 2024-06-16 MED ORDER — CYANOCOBALAMIN 1000 MCG/ML IJ SOLN
1000.0000 ug | Freq: Once | INTRAMUSCULAR | Status: AC
  Administered 2024-06-16: 1000 ug via INTRAMUSCULAR

## 2024-06-16 NOTE — Progress Notes (Signed)
 Patient here for monthly b12 injection per physicians order.  Injection given in right deltoid and patient tolerated well.

## 2024-06-30 ENCOUNTER — Other Ambulatory Visit: Payer: Self-pay | Admitting: Family

## 2024-06-30 ENCOUNTER — Telehealth: Payer: Self-pay

## 2024-06-30 ENCOUNTER — Other Ambulatory Visit: Payer: Self-pay | Admitting: Family Medicine

## 2024-06-30 MED ORDER — TRAMADOL HCL 50 MG PO TABS: ORAL_TABLET | ORAL | 0 refills | Status: AC

## 2024-06-30 NOTE — Telephone Encounter (Signed)
 Requesting: tramadol  Contract: No UDS: no Last Visit: 08/13/23 Next Visit: 09/22/24 Last Refill: 04/10/24  Please Advise

## 2024-06-30 NOTE — Telephone Encounter (Signed)
 Copied from CRM #[id number]. Topic: Clinical - Medication Question >> Jun 30, 2024  3:06 PM Shereese L wrote: Reason for CRM: Patient is calling to check the status of traMADol  (ULTRAM ) 50 MG tablet. @ request have been put in but nothing has been approved

## 2024-06-30 NOTE — Telephone Encounter (Signed)
 Copied from CRM [id number]. Topic: Clinical - Medication Refill >> Jun 30, 2024  9:41 AM Mia F wrote: Medication: traMADol  (ULTRAM ) 50 MG tablet   Has the patient contacted their pharmacy? No (Agent: If no, request that the patient contact the pharmacy for the refill. If patient does not wish to contact the pharmacy document the reason why and proceed with request.) (Agent: If yes, when and what did the pharmacy advise?)  This is the patient's preferred pharmacy:  [facility] [address], [place] - [address] [place] [address] Phone: [phone] Fax: [phone]  Is this the correct pharmacy for this prescription? Yes If no, delete pharmacy and type the correct one.   Has the prescription been filled recently? No  Is the patient out of the medication? Yes  Has the patient been seen for an appointment in the last year OR does the patient have an upcoming appointment? Yes  Can we respond through MyChart? Yes  Agent: Please be advised that Rx refills may take up to 3 business days. We ask that you follow-up with your pharmacy.

## 2024-07-07 ENCOUNTER — Other Ambulatory Visit: Payer: Self-pay

## 2024-07-07 ENCOUNTER — Emergency Department (HOSPITAL_BASED_OUTPATIENT_CLINIC_OR_DEPARTMENT_OTHER)
Admission: EM | Admit: 2024-07-07 | Discharge: 2024-07-07 | Disposition: A | Discharge status: Home / Self Care | Attending: Emergency Medicine | Admitting: Emergency Medicine

## 2024-07-07 ENCOUNTER — Encounter (HOSPITAL_BASED_OUTPATIENT_CLINIC_OR_DEPARTMENT_OTHER): Payer: Self-pay

## 2024-07-07 DIAGNOSIS — T162XXA Foreign body in left ear, initial encounter: Secondary | ICD-10-CM | POA: Insufficient documentation

## 2024-07-07 DIAGNOSIS — X58XXXA Exposure to other specified factors, initial encounter: Secondary | ICD-10-CM | POA: Diagnosis not present

## 2024-07-07 DIAGNOSIS — Z7982 Long term (current) use of aspirin: Secondary | ICD-10-CM | POA: Diagnosis not present

## 2024-07-07 DIAGNOSIS — Z79899 Other long term (current) drug therapy: Secondary | ICD-10-CM | POA: Diagnosis not present

## 2024-07-07 NOTE — ED Notes (Signed)
 Reviewed AVS/discharge instruction with patient. Time allotted for and all questions answered. Patient is agreeable for d/c and escorted to ed exit by staff.

## 2024-07-07 NOTE — Discharge Instructions (Addendum)
 No foreign body visualized on exam.  You likely had a blood clot from the trauma from the Q-tip.  If you continue to feel this foreign body sensation please call the ear nose throat to follow-up as they have better equipment to handle this.  Return for any emergent symptoms.

## 2024-07-07 NOTE — ED Triage Notes (Signed)
 Pt complaining of her ear feeling like there is something in it

## 2024-07-07 NOTE — ED Provider Notes (Signed)
 " [place] [facility]MENT AT [facility] Provider Note   CSN: [id number] Arrival date & time: 07/07/24  1053     Patient presents with: Foreign Body in Ear   Hubert Mehra is a 79 y.o. female.   79 year old female presents today for concern of potential foreign body in her left ear that she noted this morning.  She tried to use a Q-tip to remove it.  Noticed some pink-tinged discoloration on the swab.  No other complaints.  The history is provided by the patient. No language interpreter was used.       Prior to Admission medications  Medication Sig Start Date End Date Taking? Authorizing Provider  albuterol  (VENTOLIN  HFA) 108 (90 Base) MCG/ACT inhaler Inhale 2 puffs into the lungs every 6 (six) hours as needed for wheezing or shortness of breath. 04/10/24   Douglass Kenney NOVAK, FNP  aspirin  81 MG tablet Take 1 tablet (81 mg total) by mouth daily. 10/28/17   Kathlyne Romualdo Capers, NP  Cholecalciferol (VITAMIN D ) 50 MCG (2000 UT) tablet Take 2,000 Units by mouth daily.    [provider]  fluticasone  (FLONASE ) 50 MCG/ACT nasal spray Place 2 sprays into both nostrils daily. 02/28/20   Jason Leita Repine, FNP  levothyroxine  (SYNTHROID ) 125 MCG tablet Take 1 tablet (125 mcg total) by mouth daily before breakfast. 04/10/24   Webb, Padonda B, FNP  potassium chloride  (KLOR-CON ) 8 MEQ tablet Take 1 tablet (8 mEq total) by mouth daily. Needs appt 04/10/24   Webb, Padonda B, FNP  pravastatin  (PRAVACHOL ) 40 MG tablet Take 1 tablet (40 mg total) by mouth daily. 04/10/24   Webb, Padonda B, FNP  traMADol  (ULTRAM ) 50 MG tablet TAKE 1 TABLET(50 MG) BY MOUTH EVERY 8 HOURS AS NEEDED 06/30/24   Webb, Padonda B, FNP  valsartan -hydrochlorothiazide  (DIOVAN -HCT) 160-12.5 MG tablet Take 1 tablet by mouth daily. 04/10/24   Webb, Padonda B, FNP    Allergies: Amlodipine  and Lisinopril     Review of Systems  Constitutional:  Negative for chills and fever.  HENT:  Positive for ear  pain. Negative for ear discharge.   All other systems reviewed and are negative.   Updated Vital Signs BP (!) 174/100 (BP Location: Right Arm)   Pulse 70   Temp 97.7 F (36.5 C) (Oral)   Resp 18   Ht 5' 5 (1.651 m)   Wt 93.9 kg   SpO2 96%   BMI 34.45 kg/m   Physical Exam Vitals and nursing note reviewed.  Constitutional:      General: She is not in acute distress.    Appearance: Normal appearance. She is not ill-appearing.  HENT:     Head: Normocephalic and atraumatic.     Ears:     Comments: Dark area of what appears to be blood clot attached to the inner ear canal wall however does have what appears to either be a hair that is coated with blood or a leg of an insect sticking out.  Not moving.    Nose: Nose normal.  Eyes:     Conjunctiva/sclera: Conjunctivae normal.  Cardiovascular:     Rate and Rhythm: Normal rate.  Pulmonary:     Effort: Pulmonary effort is normal. No respiratory distress.  Musculoskeletal:        General: No deformity.  Skin:    Findings: No rash.  Neurological:     Mental Status: She is alert.     (all labs ordered are listed, but only abnormal results are displayed)  Labs Reviewed - No data to display  EKG: None  Radiology: No results found.   Procedures   Medications Ordered in the ED - No data to display                                  Medical Decision Making  79 year old female presents with her daughter for concern of foreign body in her left ear.  Exam with an area of dark area with a hair or insect leg sticking out.  Lidocaine used prior to attempt at removal of the foreign body.  Attempted to use curette to pull out the foreign body.  Feel this was likely a blood clot as it dissipated on the ear canal.  No definite visualization of the foreign body afterwards.  Will provide with follow-up of ENT in case she continues to feel like foreign body.  Discharged in stable condition.  Patient and daughter voiced understanding and are  in agreement with plan.   Final diagnoses:  Foreign body of left ear, initial encounter    ED Discharge Orders     None          Hildegard Loge, PA-C 07/07/24 1255    Levander Houston, MD 07/20/24 1434  "

## 2024-07-11 ENCOUNTER — Ambulatory Visit (INDEPENDENT_AMBULATORY_CARE_PROVIDER_SITE_OTHER): Payer: Medicare Other

## 2024-07-11 VITALS — BP 120/60 | HR 80 | Temp 97.7°F | Ht 65.0 in | Wt 207.6 lb

## 2024-07-11 DIAGNOSIS — Z0000 Encounter for general adult medical examination without abnormal findings: Secondary | ICD-10-CM
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
## 2024-07-11 NOTE — Patient Instructions (Addendum)
 Ms. Colcord,  Thank you for taking the time for your Medicare Wellness Visit. I appreciate your continued commitment to your health goals. Please review the care plan we discussed, and feel free to reach out if I can assist you further.  Please note that Annual Wellness Visits do not include a physical exam. Some assessments may be limited, especially if the visit was conducted virtually. If needed, we may recommend an in-person follow-up with your provider.  Ongoing Care Seeing your primary care provider every 3 to 6 months helps us  monitor your health and provide consistent, personalized care.    Referrals If a referral was made during today's visit and you haven't received any updates within two weeks, please contact the referred provider directly to check on the status.  Recommended Screenings:  Health Maintenance  Topic Date Due   Zoster (Shingles) Vaccine (1 of 2) Never done   COVID-19 Vaccine (4 - 2025-26 season) 12/04/2024   Medicare Annual Wellness Visit  07/11/2025   DTaP/Tdap/Td vaccine (2 - Td or Tdap) 12/22/2025   Pneumococcal Vaccine for age over 79  Completed   Flu Shot  Completed   Osteoporosis screening with Bone Density Scan  Completed   Hepatitis C Screening  Completed   Meningitis B Vaccine  Aged Out   Breast Cancer Screening  Discontinued   Cologuard (Stool DNA test)  Discontinued   Opioid Pain Medicine Management Opioids are powerful medicines that are used to treat moderate to severe pain. When used for short periods of time, they can help you to: Sleep better. Do better in physical or occupational therapy. Feel better in the first few days after an injury. Recover from surgery. Opioids should be taken with the supervision of a trained health care provider. They should be taken for the shortest period of time possible. This is because opioids can be addictive, and the longer you take opioids, the greater your risk of addiction. This addiction can also be  called opioid use disorder. What are the risks? Using opioid pain medicines for longer than 3 days increases your risk of side effects. Side effects include: Constipation. Nausea and vomiting. Breathing difficulties (respiratory depression). Drowsiness. Confusion. Opioid use disorder. Itching. Taking opioid pain medicine for a long period of time can affect your ability to do daily tasks. It also puts you at risk for: Motor vehicle crashes. Depression. Suicide. Heart attack. Overdose, which can be life-threatening. What is a pain treatment plan? A pain treatment plan is an agreement between you and your health care provider. Pain is unique to each person, and treatments vary depending on your condition. To manage your pain, you and your health care provider need to work together. To help you do this: Discuss the goals of your treatment, including how much pain you might expect to have and how you will manage the pain. Review the risks and benefits of taking opioid medicines. Remember that a good treatment plan uses more than one approach and minimizes the chance of side effects. Be honest about the amount of medicines you take and about any drug or alcohol use. Get pain medicine prescriptions from only one health care provider. Pain can be managed with many types of alternative treatments. Ask your health care provider to refer you to one or more specialists who can help you manage pain through: Physical or occupational therapy. Counseling (cognitive behavioral therapy). Good nutrition. Biofeedback. Massage. Meditation. Non-opioid medicine. Following a gentle exercise program. How to use opioid pain medicine Taking medicine  Take your pain medicine exactly as told by your health care provider. Take it only when you need it. If your pain gets less severe, you may take less than your prescribed dose if your health care provider approves. If you are not having pain, do nottake pain  medicine unless your health care provider tells you to take it. If your pain is severe, do nottry to treat it yourself by taking more pills than instructed on your prescription. Contact your health care provider for help. Write down the times when you take your pain medicine. It is easy to become confused while on pain medicine. Writing the time can help you avoid overdose. Take other over-the-counter or prescription medicines only as told by your health care provider. Keeping yourself and others safe  While you are taking opioid pain medicine: Do not drive, use machinery, or power tools. Do not sign legal documents. Do not drink alcohol. Do not take sleeping pills. Do not supervise children by yourself. Do not do activities that require climbing or being in high places. Do not go to a lake, river, ocean, spa, or swimming pool. Do not share your pain medicine with anyone. Keep pain medicine in a locked cabinet or in a secure area where pets and children cannot reach it. Stopping your use of opioids If you have been taking opioid medicine for more than a few weeks, you may need to slowly decrease (taper) how much you take until you stop completely. Tapering your use of opioids can decrease your risk of symptoms of withdrawal, such as: Pain and cramping in the abdomen. Nausea. Sweating. Sleepiness. Restlessness. Uncontrollable shaking (tremors). Cravings for the medicine. Do not attempt to taper your use of opioids on your own. Talk with your health care provider about how to do this. Your health care provider may prescribe a step-down schedule based on how much medicine you are taking and how long you have been taking it. Getting rid of leftover pills Do not save any leftover pills. Get rid of leftover pills safely by: Taking the medicine to a prescription take-back program. This is usually offered by the county or law enforcement. Bringing them to a pharmacy that has a drug disposal  container. Flushing them down the toilet. Check the label or package insert of your medicine to see whether this is safe to do. Throwing them out in the trash. Check the label or package insert of your medicine to see whether this is safe to do. If it is safe to throw it out, remove the medicine from the original container, put it into a sealable bag or container, and mix it with used coffee grounds, food scraps, dirt, or cat litter before putting it in the trash. Follow these instructions at home: Activity Do exercises as told by your health care provider. Avoid activities that make your pain worse. Return to your normal activities as told by your health care provider. Ask your health care provider what activities are safe for you. General instructions You may need to take these actions to prevent or treat constipation: Drink enough fluid to keep your urine pale yellow. Take over-the-counter or prescription medicines. Eat foods that are high in fiber, such as beans, whole grains, and fresh fruits and vegetables. Limit foods that are high in fat and processed sugars, such as fried or sweet foods. Keep all follow-up visits. This is important. Where to find support If you have been taking opioids for a long time, you may benefit from  receiving support for quitting from a local support group or counselor. Ask your health care provider for a referral to these resources in your area. Where to find more information Centers for Disease Control and Prevention (CDC): footballexhibition.com.br [place] Food and Drug Administration (FDA): pumpkinsearch.com.ee Get help right away if: You may have taken too much of an opioid (overdosed). Common symptoms of an overdose: Your breathing is slower or more shallow than normal. You have a very slow heartbeat (pulse). You have slurred speech. You have nausea and vomiting. Your pupils become very small. You have other potential symptoms: You are very confused. You faint or feel like  you will faint. You have cold, clammy skin. You have blue lips or fingernails. You have thoughts of harming yourself or harming others. These symptoms may represent a serious problem that is an emergency. Do not wait to see if the symptoms will go away. Get medical help right away. Call your local emergency services (911 in the [place] Do not drive yourself to the hospital.  If you ever feel like you may hurt yourself or others, or have thoughts about taking your own life, get help right away. Go to your nearest emergency department or: Call your local emergency services (911 in the [place] Call the [facility] (513-366-9684 in the [place] Call a suicide crisis helpline, such as the National Suicide Prevention Lifeline at 812 345 9902 or 988 in the [place] This is open 24 hours a day in the [place] If youre a Veteran: Call 988 and press 1. This is open 24 hours a day. Text the [employer] at 501-783-4391. Summary Opioid medicines can help you manage moderate to severe pain for a short period of time. A pain treatment plan is an agreement between you and your health care provider. Discuss the goals of your treatment, including how much pain you might expect to have and how you will manage the pain. If you think that you or someone else may have taken too much of an opioid, get medical help right away. This information is not intended to replace advice given to you by your health care provider. Make sure you discuss any questions you have with your health care provider. Document Revised: 03/29/2023 Document Reviewed: 10/02/2020 Elsevier Patient Education  2024 [employer] Inc.    07/11/2024   10:49 AM  Advanced Directives  Does Patient Have a Medical Advance Directive? No  Would patient like information on creating a medical advance directive? No - Patient declined    Vision: Annual vision screenings are recommended for early detection of glaucoma, cataracts, and diabetic  retinopathy. These exams can also reveal signs of chronic conditions such as diabetes and high blood pressure.  Dental: Annual dental screenings help detect early signs of oral cancer, gum disease, and other conditions linked to overall health, including heart disease and diabetes.  Please see the attached documents for additional preventive care recommendations.

## 2024-08-04 ENCOUNTER — Other Ambulatory Visit: Payer: Self-pay | Admitting: Family

## 2024-08-04 DIAGNOSIS — I10 Essential (primary) hypertension: Secondary | ICD-10-CM

## 2024-08-04 DIAGNOSIS — R609 Edema, unspecified: Secondary | ICD-10-CM

## 2024-09-22 ENCOUNTER — Encounter: Admitting: Family Medicine
